# Patient Record
Sex: Male | Born: 1970 | Race: White | Hispanic: No | Marital: Married | Smoking: Current every day smoker
Health system: Southern US, Community
[De-identification: ages and names within clinical notes are randomized; demographics above are authoritative.]

## PROBLEM LIST (undated history)

## (undated) DIAGNOSIS — R634 Abnormal weight loss: Secondary | ICD-10-CM

## (undated) DIAGNOSIS — C8590 Non-Hodgkin lymphoma, unspecified, unspecified site: Secondary | ICD-10-CM

## (undated) DIAGNOSIS — F1910 Other psychoactive substance abuse, uncomplicated: Secondary | ICD-10-CM

## (undated) DIAGNOSIS — G4730 Sleep apnea, unspecified: Secondary | ICD-10-CM

## (undated) DIAGNOSIS — F329 Major depressive disorder, single episode, unspecified: Secondary | ICD-10-CM

## (undated) DIAGNOSIS — K219 Gastro-esophageal reflux disease without esophagitis: Secondary | ICD-10-CM

## (undated) DIAGNOSIS — R0602 Shortness of breath: Secondary | ICD-10-CM

## (undated) DIAGNOSIS — R011 Cardiac murmur, unspecified: Secondary | ICD-10-CM

## (undated) DIAGNOSIS — C801 Malignant (primary) neoplasm, unspecified: Secondary | ICD-10-CM

## (undated) DIAGNOSIS — F32A Depression, unspecified: Secondary | ICD-10-CM

## (undated) DIAGNOSIS — S3992XA Unspecified injury of lower back, initial encounter: Secondary | ICD-10-CM

## (undated) HISTORY — DX: Other psychoactive substance abuse, uncomplicated: F19.10

## (undated) HISTORY — PX: KNEE SURGERY: SHX244

## (undated) HISTORY — DX: Depression, unspecified: F32.A

## (undated) HISTORY — DX: Cardiac murmur, unspecified: R01.1

## (undated) HISTORY — DX: Major depressive disorder, single episode, unspecified: F32.9

---

## 2002-06-02 ENCOUNTER — Encounter: Payer: Self-pay | Admitting: *Deleted

## 2002-06-02 ENCOUNTER — Emergency Department (HOSPITAL_COMMUNITY): Admission: AD | Admit: 2002-06-02 | Discharge: 2002-06-02 | Payer: Self-pay | Admitting: *Deleted

## 2003-05-03 ENCOUNTER — Emergency Department (HOSPITAL_COMMUNITY): Admission: EM | Admit: 2003-05-03 | Discharge: 2003-05-03 | Payer: Self-pay | Admitting: Emergency Medicine

## 2004-03-12 ENCOUNTER — Emergency Department (HOSPITAL_COMMUNITY): Admission: EM | Admit: 2004-03-12 | Discharge: 2004-03-12 | Payer: Self-pay | Admitting: Emergency Medicine

## 2004-03-15 ENCOUNTER — Emergency Department (HOSPITAL_COMMUNITY): Admission: EM | Admit: 2004-03-15 | Discharge: 2004-03-16 | Payer: Self-pay | Admitting: Emergency Medicine

## 2004-10-10 ENCOUNTER — Emergency Department (HOSPITAL_COMMUNITY): Admission: EM | Admit: 2004-10-10 | Discharge: 2004-10-10 | Payer: Self-pay | Admitting: Emergency Medicine

## 2004-11-24 ENCOUNTER — Ambulatory Visit: Payer: Self-pay | Admitting: Internal Medicine

## 2005-04-13 ENCOUNTER — Ambulatory Visit: Payer: Self-pay | Admitting: Internal Medicine

## 2005-10-04 ENCOUNTER — Emergency Department (HOSPITAL_COMMUNITY): Admission: EM | Admit: 2005-10-04 | Discharge: 2005-10-04 | Payer: Self-pay | Admitting: Emergency Medicine

## 2005-10-26 ENCOUNTER — Emergency Department (HOSPITAL_COMMUNITY): Admission: EM | Admit: 2005-10-26 | Discharge: 2005-10-27 | Payer: Self-pay | Admitting: *Deleted

## 2005-10-29 ENCOUNTER — Ambulatory Visit: Payer: Self-pay | Admitting: Internal Medicine

## 2006-02-20 ENCOUNTER — Emergency Department (HOSPITAL_COMMUNITY): Admission: EM | Admit: 2006-02-20 | Discharge: 2006-02-20 | Payer: Self-pay | Admitting: Emergency Medicine

## 2006-05-11 ENCOUNTER — Emergency Department (HOSPITAL_COMMUNITY): Admission: EM | Admit: 2006-05-11 | Discharge: 2006-05-12 | Payer: Self-pay | Admitting: Emergency Medicine

## 2006-05-11 ENCOUNTER — Ambulatory Visit: Payer: Self-pay | Admitting: Psychiatry

## 2006-05-11 ENCOUNTER — Inpatient Hospital Stay (HOSPITAL_COMMUNITY): Admission: RE | Admit: 2006-05-11 | Discharge: 2006-05-15 | Payer: Self-pay | Admitting: Psychiatry

## 2007-08-17 ENCOUNTER — Emergency Department (HOSPITAL_COMMUNITY): Admission: EM | Admit: 2007-08-17 | Discharge: 2007-08-17 | Payer: Self-pay | Admitting: Emergency Medicine

## 2008-07-12 ENCOUNTER — Inpatient Hospital Stay (HOSPITAL_COMMUNITY): Admission: EM | Admit: 2008-07-12 | Discharge: 2008-07-16 | Payer: Self-pay | Admitting: Emergency Medicine

## 2009-07-12 ENCOUNTER — Emergency Department (HOSPITAL_COMMUNITY): Admission: EM | Admit: 2009-07-12 | Discharge: 2009-07-12 | Payer: Self-pay | Admitting: Emergency Medicine

## 2009-07-23 ENCOUNTER — Ambulatory Visit: Payer: Self-pay | Admitting: Family

## 2009-07-23 ENCOUNTER — Ambulatory Visit: Payer: Self-pay | Admitting: Diagnostic Radiology

## 2009-07-23 ENCOUNTER — Emergency Department (HOSPITAL_BASED_OUTPATIENT_CLINIC_OR_DEPARTMENT_OTHER): Admission: EM | Admit: 2009-07-23 | Discharge: 2009-07-23 | Payer: Self-pay | Admitting: Emergency Medicine

## 2009-07-23 DIAGNOSIS — R109 Unspecified abdominal pain: Secondary | ICD-10-CM | POA: Insufficient documentation

## 2010-01-20 ENCOUNTER — Ambulatory Visit: Payer: Self-pay | Admitting: Radiology

## 2010-01-20 ENCOUNTER — Emergency Department (HOSPITAL_BASED_OUTPATIENT_CLINIC_OR_DEPARTMENT_OTHER): Admission: EM | Admit: 2010-01-20 | Discharge: 2010-01-20 | Payer: Self-pay | Admitting: Emergency Medicine

## 2010-04-02 NOTE — Assessment & Plan Note (Signed)
Summary: blood in stool/cbs--room 4   Vital Signs:  Patient profile:   40 year old male Height:      72 inches Weight:      170 pounds BMI:     23.14 Temp:     98.6 degrees F oral Pulse rate:   102 / minute Pulse rhythm:   regular Resp:     16 per minute BP sitting:   138 / 72  (right arm) Cuff size:   regular  Vitals Entered By: Mervin Kung CMA (Jul 23, 2009 1:28 PM) CC: room 4 Intermittent  Abdominal pain and bright red blood in stools since yesterday. No n/v. Is Patient Diabetic? No Pain Assessment Patient in pain? yes     Location: abdomen Intensity: 10+ Type: sharp   CC:  room 4 Intermittent  Abdominal pain and bright red blood in stools since yesterday. No n/v..  History of Present Illness: Mr Holcomb is a 40 yearold male who presents with severe abdominal pain which started yesterday at 5:30PM.  Notes that he has had BRBPR x 6 today, most recent BM was "orange in color".  Patient notes that 1 month ago he had 1 episode of melena.  Denies vomitting but notes + nausea.  Pain is worse if he eats or drinks.  Patient was told that he had a ulcer years a go.  Patient tells me that he has seen Dr.  Alwyn Ren in the past, but has not seen him in a while.    Allergies (verified): No Known Drug Allergies  Past History:  Past Medical History: Depression Heart Murmur polysubstance abuse (reportedly clean x 3 1/2 years)  Past Surgical History: knee surgery  Family History: Mom- No health problems Dad-healthy 1 brother- healthy 1 sister-healthy 27 year old daughter- healthy 18 year old sonr- healthy  Social History: Works as a Warehouse manager Denies ETOH Previous substance abuse (marijuana and cocaine)- reportedly clean +smoking- 1 to 1.5 packs/day  Physical Exam  General:  Uncomfortable appearing white male, holding stomach during much of the exam. Lungs:  Normal respiratory effort, chest expands symmetrically. Lungs are clear to auscultation, no crackles  or wheezes. Heart:  Normal rate and regular rhythm. S1 and S2 normal without gallop, murmur, click, rub or other extra sounds. Abdomen:  Soft, hypoactive bowel sounds.  + pain noted in RUQ on deep palpation, mild guarding is noted. Psych:  Cognition and judgment appear intact. Alert and cooperative with normal attention span and concentration. No apparent delusions, illusions, hallucinations   Impression & Recommendations:  Problem # 1:  ABDOMINAL PAIN, ACUTE (ICD-789.00) Assessment New Patient is with acute abdominal pain and hematochezia.  Recent NSAID use due to history of  back pain.  I suspect that his symptoms are due to Ulcer which may be bleeding.  Spoke to [facility] [place] PA-C)  They perfer that the patient be sent to the ER to ensure that he is stable prior to admitting to floor.  Will send downstairs to ED.  Complete Medication List: 1)  Ecotrin Low Strength 81 Mg Tbec (Aspirin) .... Take 1 tablet by mouth once a day  Patient Instructions: 1)  Please go directly to the ED downstairs for evaluation. 2)  Follow up with Dr. Alwyn Ren after you are discharged. 3)  Stop aspirin and anti-inflammatories (ibuprofen, aleve etc.)  Current Allergies (reviewed today): No known allergies

## 2010-04-24 ENCOUNTER — Emergency Department (HOSPITAL_COMMUNITY): Payer: Self-pay

## 2010-04-24 ENCOUNTER — Emergency Department (HOSPITAL_COMMUNITY)
Admission: EM | Admit: 2010-04-24 | Discharge: 2010-04-24 | Disposition: A | Discharge status: Home / Self Care | Payer: Self-pay | Attending: Emergency Medicine | Admitting: Emergency Medicine

## 2010-04-24 DIAGNOSIS — R404 Transient alteration of awareness: Secondary | ICD-10-CM | POA: Insufficient documentation

## 2010-04-24 DIAGNOSIS — S0003XA Contusion of scalp, initial encounter: Secondary | ICD-10-CM | POA: Insufficient documentation

## 2010-04-24 DIAGNOSIS — Y929 Unspecified place or not applicable: Secondary | ICD-10-CM | POA: Insufficient documentation

## 2010-04-24 DIAGNOSIS — S0990XA Unspecified injury of head, initial encounter: Secondary | ICD-10-CM | POA: Insufficient documentation

## 2010-04-24 DIAGNOSIS — R51 Headache: Secondary | ICD-10-CM | POA: Insufficient documentation

## 2010-04-24 DIAGNOSIS — H9319 Tinnitus, unspecified ear: Secondary | ICD-10-CM | POA: Insufficient documentation

## 2010-05-18 LAB — URINALYSIS, ROUTINE W REFLEX MICROSCOPIC
Bilirubin Urine: NEGATIVE
Glucose, UA: NEGATIVE mg/dL
Leukocytes, UA: NEGATIVE
Nitrite: NEGATIVE
Urobilinogen, UA: 0.2 mg/dL (ref 0.0–1.0)
pH: 7 (ref 5.0–8.0)

## 2010-05-18 LAB — COMPREHENSIVE METABOLIC PANEL
ALT: 21 U/L (ref 0–53)
AST: 26 U/L (ref 0–37)
BUN: 12 mg/dL (ref 6–23)
Calcium: 9.3 mg/dL (ref 8.4–10.5)
Chloride: 103 mEq/L (ref 96–112)
GFR calc non Af Amer: >60 mL/min (ref >60)
Glucose, Bld: 88 mg/dL (ref 70–99)
Potassium: 4.1 mEq/L (ref 3.5–5.1)
Total Protein: 7.4 g/dL (ref 6.0–8.3)

## 2010-05-18 LAB — DIFFERENTIAL
Basophils Relative: 2 % — ABNORMAL HIGH (ref 0–1)
Eosinophils Absolute: 0 10*3/uL (ref 0.0–0.7)
Lymphocytes Relative: 9 % — ABNORMAL LOW (ref 12–46)
Monocytes Relative: 6 % (ref 3–12)

## 2010-05-18 LAB — HEMOCCULT GUIAC POC 1CARD (OFFICE): Fecal Occult Bld: NEGATIVE

## 2010-05-19 LAB — POCT CARDIAC MARKERS
CKMB, poc: <1 ng/mL — ABNORMAL LOW (ref 1.0–8.0)
Myoglobin, poc: 48.7 ng/mL (ref 12–200)
Myoglobin, poc: 54 ng/mL (ref 12–200)
Troponin i, poc: <0.05 ng/mL (ref 0.00–0.09)

## 2010-05-19 LAB — RAPID URINE DRUG SCREEN, HOSP PERFORMED
Amphetamines: NOT DETECTED
Cocaine: NOT DETECTED
Tetrahydrocannabinol: POSITIVE — AB

## 2010-05-19 LAB — DIFFERENTIAL
Basophils Absolute: 0.1 10*3/uL (ref 0.0–0.1)
Basophils Relative: 1 % (ref 0–1)
Eosinophils Relative: 3 % (ref 0–5)
Lymphocytes Relative: 28 % (ref 12–46)
Neutro Abs: 4.3 10*3/uL (ref 1.7–7.7)

## 2010-05-19 LAB — COMPREHENSIVE METABOLIC PANEL
ALT: 18 U/L (ref 0–53)
Albumin: 3.7 g/dL (ref 3.5–5.2)
BUN: 9 mg/dL (ref 6–23)
Calcium: 8.9 mg/dL (ref 8.4–10.5)
Chloride: 102 mEq/L (ref 96–112)
Creatinine, Ser: 1.1 mg/dL (ref 0.4–1.5)
GFR calc non Af Amer: >60 mL/min (ref >60)
Total Bilirubin: 0.7 mg/dL (ref 0.3–1.2)

## 2010-05-19 LAB — CBC
HCT: 41.8 % (ref 39.0–52.0)
Hemoglobin: 14.4 g/dL (ref 13.0–17.0)
WBC: 6.9 10*3/uL (ref 4.0–10.5)

## 2010-06-09 LAB — CBC
HCT: 40.7 % (ref 39.0–52.0)
Hemoglobin: 14.6 g/dL (ref 13.0–17.0)
Hemoglobin: 15.4 g/dL (ref 13.0–17.0)
MCV: 88.2 fL (ref 78.0–100.0)
MCV: 88.2 fL (ref 78.0–100.0)
Platelets: 192 10*3/uL (ref 150–400)
RBC: 4.97 MIL/uL (ref 4.22–5.81)
RDW: 13.7 % (ref 11.5–15.5)
RDW: 13.8 % (ref 11.5–15.5)
WBC: 7.4 10*3/uL (ref 4.0–10.5)

## 2010-06-09 LAB — BASIC METABOLIC PANEL
BUN: 8 mg/dL (ref 6–23)
CO2: 25 mEq/L (ref 19–32)
CO2: 25 mEq/L (ref 19–32)
Chloride: 108 mEq/L (ref 96–112)
Creatinine, Ser: 1.1 mg/dL (ref 0.4–1.5)
GFR calc Af Amer: >60 mL/min (ref >60)
GFR calc non Af Amer: >60 mL/min (ref >60)
Sodium: 139 mEq/L (ref 135–145)

## 2010-06-09 LAB — DIFFERENTIAL
Basophils Absolute: 0.1 10*3/uL (ref 0.0–0.1)
Eosinophils Absolute: 0.2 10*3/uL (ref 0.0–0.7)
Eosinophils Relative: 2 % (ref 0–5)
Lymphocytes Relative: 38 % (ref 12–46)
Lymphs Abs: 2.3 10*3/uL (ref 0.7–4.0)
Monocytes Absolute: 0.5 10*3/uL (ref 0.1–1.0)
Neutro Abs: 4.5 10*3/uL (ref 1.7–7.7)
Neutrophils Relative %: 61 % (ref 43–77)

## 2010-06-09 LAB — GLUCOSE, CAPILLARY: Glucose-Capillary: 93 mg/dL (ref 70–99)

## 2010-07-14 NOTE — Discharge Summary (Signed)
[name], [name]               ACCOUNT NO.:  [id number]   MEDICAL RECORD NO.:  [id number]          PATIENT TYPE:  INP   LOCATION:  A338                          FACILITY:  [facility]   PHYSICIAN:  Osvaldo Shipper, MD     DATE OF BIRTH:  04-14-1970   DATE OF ADMISSION:  07/12/2008  DATE OF DISCHARGE:  05/18/2010LH                               DISCHARGE SUMMARY   Please refer to H&P dictated at the time of admission for details  regarding the patient's presenting illness.   The patient does not have a PMD.   DISCHARGE DIAGNOSES:  1. Acute lower back pain with lumbar radiculopathy.  2. L4-L5 disk herniation and annular tear, and L5-S1 disk herniation.   BRIEF HOSPITAL COURSE:  Briefly, this is a 40 year old white male who  was in his usual state of health the day prior to admission when he  lifted his son and was using the vacuum cleaner when he suddenly  experienced excruciating lower back pain.  He decided to rest for the  rest of the day, however, did not feel any better and so decided to come  into the hospital.  Underwent thoracic spine film which was negative.  Lumbar spine films showed mild disk height loss at L5-S1.  Subsequently,  MRI of the L-spine was obtained which showed probable acute annular  tears at L4-L5 centrally with a small, focal, subligamentous disk  herniation centrally and into the right lateral recess likely  compressing the right L5 nerve root.  Soft disk herniation centrally and  into the left neural foramen was also noted as L5-S1.   The patient was admitted for pain control.  He was put on muscle  relaxants, narcotic agents, NSAIDs, and steroids.  The patient was seen  by physical therapists.  He slowly started improving.  He was able to  ambulate with a walker.  Home Health PT is being set up for him.  This  case was discussed by the ED physician with Dr. Gerlene Fee and so an  appointment has been made with this physician for this patient.  On the  day of  discharge, the patient is feeling well.  He is very satisfied  with his care here.  Denies any complaints this morning except for  persistent pain which has improved as well.  Vital signs are all stable.  He does not have any neurological deficits at this time.   Lungs are clear to auscultation.  Cardiovascular is S1, S2 is normal and  regular.   The patient is stable for discharge home with Home Health.   DISCHARGE MEDICATIONS:  1. Flexeril 10 mg t.i.d.  2. Colace 100 mg b.i.d.  3. Neurontin 300 mg t.i.d.  4. Indomethacin 25 mg b.i.d.  5. Ultram 50 mg 4 times daily for 2 weeks and then as needed, 60      tablets prescribed.  6. MiraLax 17 g daily.  7. Prednisone 60 mg daily for 4 days, followed by 40 mg daily for 4      days, followed by 20 mg daily for 4 days, followed by  10 mg daily      for 4 days.   Follow up with Dr. Gerlene Fee.  Appointment made for Jul 22, 2008 at 1:45  p.m.  Home Health PT to be set up.  He has already been arranged a  walker.   DIET:  As before.   PHYSICAL ACTIVITY:  Per physical therapist.   TOTAL TIME OF THIS DISCHARGE ENCOUNTER:  35 minutes.      Osvaldo Shipper, MD  Electronically Signed     GK/MEDQ  D:  07/16/2008  T:  07/17/2008  Job:  [id number]   cc:   Reinaldo Meeker, M.D.  Fax: [phone]

## 2010-07-14 NOTE — H&P (Signed)
NAMEJENTRY, WARNELL               ACCOUNT NO.:  [id number]   MEDICAL RECORD NO.:  [id number]          PATIENT TYPE:  EMS   LOCATION:  ED                            FACILITY:  [facility]   PHYSICIAN:  Osvaldo Shipper, MD     DATE OF BIRTH:  1970/07/20   DATE OF ADMISSION:  07/12/2008  DATE OF DISCHARGE:  LH                              HISTORY & PHYSICAL   Patient does not have a primary medical doctor.   ADMITTING DIAGNOSES:  1. Acute lower back pain.  2. History of depression.  3. History of heart murmur.   CHIEF COMPLAINT:  Back pain since yesterday morning.   HISTORY OF PRESENT ILLNESS:  The patient is a 40 year old Caucasian male  who has a history of depression which is stable who was in his usual  state of health yesterday morning when he was trying to plug a vacuum  cleaner and lifting his son at the same time, and when he did that he  felt a pop in his lower back and started experiencing excruciating pain.  The pain was more than 10/10 in intensity.  The patient, however, did  not have any falling down episodes or any sudden give-away of his legs.  The pain started getting worse throughout yesterday and also the night,  he could sleep, he could not walk because of severe pain.  He started  experiencing tingling and numbness in his feet, more so on the right  side than the left.  He could not stand on his legs this morning.  Denied any urinary or stool incontinence.  He was sweating.  He was  nauseous.  Then, since these symptoms were not improving, he decided to  come in to the hospital.  He did try taking a Xanax with no relief.  Otherwise, there is no other aggravating or relieving factors identified  except as stated above, the pain was described as a crushing sensation  with radiation down both legs as described.   MEDICATIONS AT HOME:  None.   ALLERGIES:  No known drug allergies.   PAST MEDICAL HISTORY:  Positive for:  1. Heart murmur.  2. History of depression.  He  has actually been admitted to [facility] for severe depression back in 2008.   SURGICAL HISTORY:  None.   SOCIAL HISTORY:  He is supposed to start a new job at [employer].  Smokes 1 pack of cigarettes on a daily basis.  Denies any alcohol use or any illicit drug use.   FAMILY HISTORY:  Positive for heart disease, colon cancer, otherwise  unremarkable.   REVIEW OF SYSTEMS:  GENERAL:  Positive for weakness.  HEENT:  Unremarkable.  CARDIOVASCULAR:  Unremarkable.  RESPIRATORY:  Unremarkable.  GI:  Unremarkable.  GU:  Unremarkable.  NEUROLOGICAL:  As  in HPI.  MUSCULOSKELETAL:  As in HPI.  Dermatological unremarkable.  PSYCHIATRIC:  Unremarkable.  OTHER SYSTEMS:  Unremarkable.   PHYSICAL EXAMINATION:  VITAL SIGNS:  Temperature 98.0, blood pressure  116/56, heart rate 16, respiratory rate 17, saturation 99%  on room air.  GENERAL:  This is a well-developed, nourished white male in no distress.  In a considerable amount of discomfort, however.  HEENT: There is no pallor, no icterus.  Oral mucous membrane is moist.  No oral lesions are noted.  NECK:  Soft and supple.  No thyromegaly appreciated.  LUNGS:  Clear to auscultation bilaterally.  No wheezing, rales or  rhonchi.  CARDIOVASCULAR:  S1-S2 is normal, regular.  No murmurs appreciated.  No  S3, no S4.  No rubs, no bruits.  ABDOMEN:  Soft, nontender, nondistended.  Bowel sounds are present.  No  masses or organomegaly appreciated.  LOWER BACK:  Tenderness over the lumbosacral spine and some muscle spasm  laterally, more to the right side.  OTHER MUSCULOSKELETAL EXAM:  He does have positive straight leg-raising  sign bilaterally, more so on the right side.  He is able to left and  flex his ankles bilaterally.  He does have good strength in his ankle  flexors.  Peripheral pulses are palpable.  Did have some sensory  deficits.  RECTAL:  Was done and he did have good rectal tone.   No labs have been  done.  He had imaging studies in the form of a  thoracic spine film which did not show any acute findings and he had a  lumbar spine film which showed mild disk height loss at L5-S1.  He had  an MRI, subsequently, which showed probable acute annular tear at L4-5  centrally with a small subligamentous disk herniation centrally and into  the right lateral recess, likely compressing the right L5 nerve root,  soft disk herniation centrally and into the left neural foramen noted at  L5-S1.   ASSESSMENT:  This is a 40 year old,  Caucasian male who comes in with  acute lower back pain with onset yesterday morning.  Persistent factor  was the very fact that he was trying to lift his son up.  He suffered  annular tear and herniation, which caused him his acute symptoms.  He  already has slight compression of the nerve root.  He will need  admission into the hospital for pain control.   PLAN:  Acute lower back pain as a result of disk herniation and tear.  The case was discussed with Dr. Gerlene Fee via the PA at the ED and he  recommends medical management at this time and follow up in his office  when the patient is somewhat better.  We will go ahead and consult our  orthopedic doctors to see if anything else needs to be done.  I will go  ahead in the meantime and start the patient on Ultram, on Indocin,  Flexeril, Solu-Medrol and Neurontin.  Physical therapy will be utilized.  A heating pad will be provided for local care.  An information sheet  will be given to him regarding back pain.  DVT prophylaxis will be  utilized.   Further management decisions will depend on the patient's response to  treatment and results of tests.  We will also order a CBC and BMET at  the same time.      Osvaldo Shipper, MD  Electronically Signed     GK/MEDQ  D:  07/12/2008  T:  07/12/2008  Job:  [id number]   cc:   Teola Bradley, M.D.  Fax: [phone]

## 2010-07-17 NOTE — Discharge Summary (Signed)
[name], [name]               ACCOUNT NO.:  [id number]   MEDICAL RECORD NO.:  [id number]          PATIENT TYPE:  IPS   LOCATION:  0306                          FACILITY:  [facility]   PHYSICIAN:  Anselm Jungling, MD  DATE OF BIRTH:  September 08, 1970   DATE OF ADMISSION:  05/11/2006  DATE OF DISCHARGE:  05/15/2006                               DISCHARGE SUMMARY   IDENTIFYING DATA/REASON FOR ADMISSION:  The patient is a 40 year old  male with a history of substance abuse and mood disorder.  He had  recently lost his job and in addition was having marital problems,  complicated by having three children, with one more on the way.  He had  been abusing both cocaine and alcohol.  He had no prior history of  inpatient or outpatient psychiatric treatment.  He had made a suicide  attempt in the past.  He was admitted this time due to increasing  suicidal risk.  Please refer to the admission note for further details  pertaining to the symptoms, circumstances and history that led to his  hospitalization.   INITIAL DIAGNOSTIC IMPRESSION:  He was given initial AXIS I diagnoses of  mood disorder not otherwise specified, substance abuse not otherwise  specified and substance-induced mood disorder.   MEDICAL/LABORATORY:  The patient was medically and physically assessed  by the psychiatric nurse practitioner.  He was in good health without  any active or chronic medical problems.   HOSPITAL COURSE:  The patient was admitted to the adult inpatient  psychiatric service.  He presented as a well-nourished, normally  developed male who was pleasant, alert and fully oriented.  His mood was  depressed with tearful affect.  He was open in giving history and  verbalized a strong desire for help.  He did not have any further  suicidal ideation or intention once admitted and stated I'm tired of  having these thoughts.  There were no signs or symptoms of psychosis.   The patient was involved in various  therapeutic activities including  those geared towards 12-step recovery.  To address intermittent anxiety,  Seroquel in low doses of 25-50 mg was utilized.  The patient was begun  on a trial of Depakote ER which was titrated during his hospital stay.   On the first hospital day, the patient did need to be sent to the  emergency department for evaluation of chest pain, but he was returned  without any acute findings.   On the fifth and final hospital day, there was a family session  involving the patient and his girlfriend.  Main concerns addressed were  around medication and finding ways to pay for it.  The patient was  advised to call his caseworker if having such problems.  He reported  that he felt well on the current medication.  He talked about staying  busy with his children and attending 12-step meetings.  The patient's  girlfriend was quite supportive and agreed to help the patient attend 12-  step meetings.  The patient stated that he was having no further  suicidal ideation, and his girlfriend  indicated she had no concerns  about him going home at that time.  He was discharged following the  session.  The patient and his girlfriend had been given information on  suicide prevention as well as the suicide prevention pamphlet and the  crisis hotline numbers card.   AFTERCARE:  The patient was to follow up at the [facility] with an  appointment on May 19, 2006.   DISCHARGE MEDICATIONS:  Depakote ER 500 mg b.i.d.   DISCHARGE DIAGNOSES:  AXIS I:  Polysubstance dependence.  Mood disorder  not otherwise specified.  AXIS II:  Deferred.  AXIS III:  No acute or chronic illnesses.  AXIS IV:  Stressors:  Severe.  AXIS V:  GAF on discharge 60.      Anselm Jungling, MD  Electronically Signed     SPB/MEDQ  D:  06/03/2006  T:  06/03/2006  Job:  [id number]

## 2011-05-03 ENCOUNTER — Emergency Department (HOSPITAL_COMMUNITY)
Admission: EM | Admit: 2011-05-03 | Discharge: 2011-05-03 | Disposition: A | Discharge status: Home / Self Care | Payer: BC Managed Care – PPO | Attending: Emergency Medicine | Admitting: Emergency Medicine

## 2011-05-03 ENCOUNTER — Encounter (HOSPITAL_COMMUNITY): Payer: Self-pay | Admitting: *Deleted

## 2011-05-03 DIAGNOSIS — F17200 Nicotine dependence, unspecified, uncomplicated: Secondary | ICD-10-CM | POA: Insufficient documentation

## 2011-05-03 DIAGNOSIS — J019 Acute sinusitis, unspecified: Secondary | ICD-10-CM | POA: Insufficient documentation

## 2011-05-03 MED ORDER — AMOXICILLIN-POT CLAVULANATE 875-125 MG PO TABS: 1.0000 | ORAL_TABLET | Freq: Two times a day (BID) | ORAL | Status: AC

## 2011-05-03 MED ORDER — AMOXICILLIN-POT CLAVULANATE 875-125 MG PO TABS
1.0000 | ORAL_TABLET | Freq: Once | ORAL | Status: AC
  Administered 2011-05-03: 1 via ORAL
  Filled 2011-05-03: qty 1

## 2011-05-03 NOTE — Discharge Instructions (Signed)

## 2011-05-03 NOTE — ED Notes (Signed)
Cold symptoms for over a month, worse for the past 3 days

## 2011-05-03 NOTE — ED Notes (Signed)
Sick for several weeks, sinus congestion sore throat, cough.  Body aches.   Brown sputum last pm.

## 2011-05-04 NOTE — ED Provider Notes (Signed)
History     CSN: [id number]  Arrival date & time 05/03/11  4098   First MD Initiated Contact with Patient 05/03/11 2015      Chief Complaint  Patient presents with  . Sinusitis    (Consider location/radiation/quality/duration/timing/severity/associated sxs/prior treatment) HPI Comments: Patient presents with a 1 month history of nasal congestion,  Itchy watery eyes and clear rhinorrhea.  The past 3 days he has developed maxillary facial pressure,  achiness in in upper teeth,  Thicker nasal congestion and subjective fever.  Patient is a 41 y.o. male presenting with sinusitis. The history is provided by the patient.  Sinusitis  This is a new problem. The current episode started more than 2 days ago. The problem has been gradually worsening. The pain is at a severity of 9/10. The pain is moderate. The pain has been constant since onset. Associated symptoms include chills and congestion. Pertinent negatives include no ear pain, no sore throat and no shortness of breath. Treatments tried: decongestants. The treatment provided mild relief.    History reviewed. No pertinent past medical history.  Past Surgical History  Procedure Date  . Knee surgery     No family history on file.  History  Substance Use Topics  . Smoking status: Current Everyday Smoker -- 1.0 packs/day  . Smokeless tobacco: Not on file  . Alcohol Use: No      Review of Systems  Constitutional: Positive for fever, chills and fatigue.  HENT: Positive for congestion and rhinorrhea. Negative for ear pain, sore throat, facial swelling and neck pain.   Eyes: Negative.   Respiratory: Negative for chest tightness and shortness of breath.   Cardiovascular: Negative for chest pain.  Gastrointestinal: Negative for nausea and abdominal pain.  Genitourinary: Negative.   Musculoskeletal: Negative for joint swelling and arthralgias.  Skin: Negative.  Negative for rash and wound.  Neurological: Negative for dizziness,  weakness, light-headedness, numbness and headaches.  Hematological: Negative.   Psychiatric/Behavioral: Negative.     Allergies  Review of patient's allergies indicates no known allergies.  Home Medications   Current Outpatient Rx  Name Route Sig Dispense Refill  . AMOXICILLIN-POT CLAVULANATE 875-125 MG PO TABS Oral Take 1 tablet by mouth 2 (two) times daily. 19 tablet 0    BP 117/82  Pulse 77  Temp(Src) 98.9 F (37.2 C) (Oral)  Resp 20  Ht 6' (1.829 m)  Wt 172 lb (78.019 kg)  BMI 23.33 kg/m2  SpO2 100%  Physical Exam  Nursing note and vitals reviewed. Constitutional: He is oriented to person, place, and time. He appears well-developed and well-nourished.  HENT:  Head: Normocephalic and atraumatic.  Right Ear: Tympanic membrane normal.  Left Ear: Tympanic membrane normal.  Nose: Mucosal edema, rhinorrhea and sinus tenderness present. Right sinus exhibits maxillary sinus tenderness. Left sinus exhibits maxillary sinus tenderness.  Mouth/Throat: Uvula is midline, oropharynx is clear and moist and mucous membranes are normal.  Eyes: Conjunctivae are normal.  Neck: Normal range of motion.  Cardiovascular: Normal rate, regular rhythm, normal heart sounds and intact distal pulses.   Pulmonary/Chest: Effort normal and breath sounds normal. He has no decreased breath sounds. He has no wheezes. He has no rhonchi. He has no rales.  Abdominal: Soft. Bowel sounds are normal. There is no tenderness.  Musculoskeletal: Normal range of motion.  Neurological: He is alert and oriented to person, place, and time.  Skin: Skin is warm and dry.  Psychiatric: He has a normal mood and affect.    ED  Course  Procedures (including critical care time)  Labs Reviewed - No data to display No results found.   1. Sinusitis acute       MDM  Augementin,  Continue otc decongestant (tylenol cold and congestion product).  Encouraged daily otc claritin when allergy sx are  present.        Candis Musa, PA 05/04/11 1341

## 2011-05-05 NOTE — ED Provider Notes (Signed)
Medical screening examination/treatment/procedure(s) were performed by non-physician practitioner and as supervising physician I was immediately available for consultation/collaboration.   Logen Heintzelman, MD 05/05/11 0832 

## 2011-07-06 ENCOUNTER — Emergency Department (HOSPITAL_COMMUNITY)
Admission: EM | Admit: 2011-07-06 | Discharge: 2011-07-06 | Disposition: A | Discharge status: Home / Self Care | Payer: BC Managed Care – PPO | Attending: Emergency Medicine | Admitting: Emergency Medicine

## 2011-07-06 ENCOUNTER — Encounter (HOSPITAL_COMMUNITY): Payer: Self-pay | Admitting: *Deleted

## 2011-07-06 ENCOUNTER — Emergency Department (HOSPITAL_COMMUNITY): Payer: BC Managed Care – PPO

## 2011-07-06 DIAGNOSIS — Y990 Civilian activity done for income or pay: Secondary | ICD-10-CM | POA: Insufficient documentation

## 2011-07-06 DIAGNOSIS — IMO0002 Reserved for concepts with insufficient information to code with codable children: Secondary | ICD-10-CM | POA: Insufficient documentation

## 2011-07-06 DIAGNOSIS — S40019A Contusion of unspecified shoulder, initial encounter: Secondary | ICD-10-CM | POA: Insufficient documentation

## 2011-07-06 DIAGNOSIS — S40012A Contusion of left shoulder, initial encounter: Secondary | ICD-10-CM

## 2011-07-06 MED ORDER — OXYCODONE-ACETAMINOPHEN 5-325 MG PO TABS: 1.0000 | ORAL_TABLET | Freq: Four times a day (QID) | ORAL | Status: AC | PRN

## 2011-07-06 MED ORDER — OXYCODONE-ACETAMINOPHEN 5-325 MG PO TABS
1.0000 | ORAL_TABLET | Freq: Once | ORAL | Status: AC
  Administered 2011-07-06: 1 via ORAL
  Filled 2011-07-06: qty 1

## 2011-07-06 NOTE — ED Provider Notes (Signed)
History  This chart was scribed for Carleene Cooper III, MD by Cherlynn Perches. The patient was seen in room APA18/APA18. Patient's care was started at 2018.   CSN: [id number]  Arrival date & time 07/06/11  2018   None     Chief Complaint  Patient presents with  . Shoulder Injury    (Consider location/radiation/quality/duration/timing/severity/associated sxs/prior treatment) Patient is a 41 y.o. male presenting with shoulder injury. The history is provided by the patient. No language interpreter was used.  Shoulder Injury This is a new problem. The current episode started 3 to 5 hours ago. The problem occurs constantly. The problem has not changed since onset.Pertinent negatives include no chest pain and no abdominal pain. The symptoms are aggravated by bending and twisting. The symptoms are relieved by nothing. He has tried nothing for the symptoms.    [name] is a 41 y.o. male who presents to the Emergency Department complaining of sudden onset, constant, moderate shoulder pain localized to the back of the left shoulder with associated swelling of the left shoulder. Pt reports that he was working under a house and a sewage pipe blew. As he tried to get out of the way, the pt hit his shoulder on the floor "pretty hard." Pt works as a Nutritional therapist. Pt states that pain is made worse by moving his arm at the shoulder joint. Pt has no significant medical history. Pt is a current everyday smoker and denies alcohol use.  History reviewed. No pertinent past medical history.  Past Surgical History  Procedure Date  . Knee surgery     No family history on file.  History  Substance Use Topics  . Smoking status: Current Everyday Smoker -- 1.0 packs/day  . Smokeless tobacco: Not on file  . Alcohol Use: No      Review of Systems  Constitutional: Negative for fever and chills.  Respiratory: Negative for cough and wheezing.   Cardiovascular: Negative for chest pain.  Gastrointestinal:  Negative for nausea, abdominal pain and diarrhea.  Musculoskeletal: Positive for joint swelling and arthralgias.  Skin: Negative for rash.  Neurological: Negative for syncope and numbness.  Psychiatric/Behavioral: Negative for confusion.  All other systems reviewed and are negative.    Allergies  Review of patient's allergies indicates no known allergies.  Home Medications  No current outpatient prescriptions on file.  Triage Vitals: BP 126/76  Pulse 75  Temp(Src) 98.1 F (36.7 C) (Oral)  Resp 18  Ht 5\' 11"  (1.803 m)  Wt 175 lb (79.379 kg)  BMI 24.41 kg/m2  SpO2 99%  Physical Exam  Nursing note and vitals reviewed. Constitutional: He is oriented to person, place, and time. He appears well-developed and well-nourished.  HENT:  Head: Normocephalic and atraumatic.  Eyes: Conjunctivae are normal. Pupils are equal, round, and reactive to light.  Neck: Normal range of motion. Neck supple. No tracheal deviation present.  Cardiovascular: Normal rate and regular rhythm.   Pulmonary/Chest: Effort normal. No respiratory distress.  Abdominal: Soft. He exhibits no distension.  Musculoskeletal: Normal range of motion. He exhibits tenderness (tenderness over body of left scapula).       2 cm Contusion over body of left scapula, no bony deformity, normal left humerus and clavicle  Neurological: He is alert and oriented to person, place, and time.       No numbness of left hand  Skin: Skin is warm and dry.  Psychiatric: He has a normal mood and affect. His behavior is normal.  ED Course  Procedures (including critical care time)  DIAGNOSTIC STUDIES: Oxygen Saturation is 99% on room air, normal by my interpretation.    COORDINATION OF CARE: 10:35AM - discussed x-ray results with pt. Advised pt to ice and will prescribe medication for pain. Pt agrees with plan.      Dg Clavicle Left  07/06/2011  *RADIOLOGY REPORT*  Clinical Data: Larey Seat, pain  LEFT CLAVICLE - 2+ VIEWS  Comparison:   None.  Findings:  There is no evidence of fracture or other focal bone lesions.  Soft tissues are unremarkable.  IMPRESSION: Negative.  Original Report Authenticated By: Elsie Stain, M.D.   Has contusion of spine of left scapula.  Can see scapula on views of clavicle, and no fracture present. Rx ice, pain medication, rest.   1. Contusion of left scapula      I personally performed the services described in this documentation, which was scribed in my presence. The recorded information has been reviewed and considered.  Osvaldo Human, M.D.      Carleene Cooper III, MD 07/07/11 [phone]

## 2011-07-06 NOTE — Discharge Instructions (Signed)
You have a contusion of the left scapula.  You can take the pain medicine Percocet every 4 hours if needed for pain.  Apply ice to the painful area for 15 minutes four times per day.  No work for 3 days.

## 2011-07-06 NOTE — ED Notes (Signed)
Given pillow to place behind left shoulder. In no distress. Denies needs. Call bell within reach. Pain 8\10. Will continue to monitor. Awaiting to be seen.

## 2011-07-06 NOTE — ED Notes (Signed)
Into room to attempt iv access. 

## 2011-07-06 NOTE — ED Notes (Signed)
Resting with eyes closed and lights off. No distress. Equal chest rise and fall, regular, unlabored. Will continue to monitor. Call bell within reach. Awaiting to be seen.

## 2011-07-06 NOTE — ED Notes (Signed)
Pt reports he was working under a house and a sewage pipe blew and he hit left shoulder on floor,

## 2011-07-06 NOTE — ED Notes (Signed)
Into room to assess patient. Resting sitting up in bed. Deformity to left clavicle and left shoulder blade. Strong left radial pulse. Brisk cap refill. No discoloration to extremity. Limited range of motion. Denies needs. No distress. Call bell within reach. Awaiting to be seen.

## 2011-07-06 NOTE — ED Notes (Signed)
MD at bedside. 

## 2011-09-07 ENCOUNTER — Ambulatory Visit (INDEPENDENT_AMBULATORY_CARE_PROVIDER_SITE_OTHER): Payer: BC Managed Care – PPO | Admitting: Internal Medicine

## 2011-09-07 ENCOUNTER — Encounter: Payer: Self-pay | Admitting: Internal Medicine

## 2011-09-07 VITALS — BP 100/68 | HR 68 | Temp 98.1°F | Resp 16 | Ht 70.0 in | Wt 158.1 lb

## 2011-09-07 DIAGNOSIS — I889 Nonspecific lymphadenitis, unspecified: Secondary | ICD-10-CM

## 2011-09-07 DIAGNOSIS — R319 Hematuria, unspecified: Secondary | ICD-10-CM

## 2011-09-07 MED ORDER — AMOXICILLIN-POT CLAVULANATE 500-125 MG PO TABS: ORAL_TABLET | ORAL | Status: DC

## 2011-09-07 NOTE — Patient Instructions (Addendum)
Use warm moist compresses to 3 times a day to the affected area.  Please try to go on My Chart within the next 24 hours to allow me to release the results directly to you.  

## 2011-09-07 NOTE — Progress Notes (Signed)
  Subjective:    Patient ID: [name], male    DOB: 12-22-1970, 41 y.o.   MRN: [id number]  HPI He noticed a slight "pulling" in the right inguinal area 08/05/11. Investigating, he noted a knot which has seemingly increased in size since. Yesterday & this morning was hard and associated with discomfort radiating obliquely, laterally in the right inguinal crease.  He's also had some sweating; a 15 pound weight loss over the last 8 months he believes is related to his high level of physical activity.  He's had no injury or vector exposure prior to the onset of the knot.  Past medical history/family history/social history were all reviewed and updated. Pertinent data entered into EMR. Remotely he did have recurrent cervical and axillary lymphadenitis.      Review of Systems He denies abdominal pain other than the inguinal discomfort. He's had no associated melena, rectal bleeding, chills, or fever. He also denies hematuria, pyuria, or dysuria.  He has no history of MRSA.     Objective:   Physical Exam General appearance is one of good health and nourishment w/o distress.  Eyes: No conjunctival inflammation or scleral icterus is present.  Oral exam: Dental hygiene is good; lips and gums are healthy appearing.There is no oropharyngeal erythema or exudate noted.   Heart:  Normal rate and regular rhythm. S1 and S2 normal without gallop, murmur, click, rub . S 4      Lungs:Chest clear to auscultation; no wheezes, rhonchi,rales ,or rubs present.No increased work of breathing.   Abdomen: bowel sounds normal, soft but slightly tender right lower quadrant without masses, organomegaly or hernias noted.  No guarding or rebound   Genital exam was unremarkable; no hernias are present. Prostate is normal to palpation. There is no adnexal tenderness on either side.  Skin:Warm & dry.  Intact without suspicious lesions or rashes ; no jaundice . His course showed a shaven; there is no evidence of  cellulitis  Lymphatic: No lymphadenopathy is noted about the head, neck,or  axilla. He has a 1.5 x 1.5 cm very tender right inguinal lymph node.              Assessment & Plan:  #1 lymphadenitis right inguinal area; no obvious site of infection, specifically no cellulitis. There is no hernia present.  #2 right lower quadrant tenderness; clinically there is no evidence of acute appendicitis.  Plan: CBC and differential, urinalysis, and oral antibiotics. Imaging should be considered if symptoms persist or progress despite the antibiotics.

## 2011-09-08 ENCOUNTER — Ambulatory Visit (INDEPENDENT_AMBULATORY_CARE_PROVIDER_SITE_OTHER)
Admission: RE | Admit: 2011-09-08 | Discharge: 2011-09-08 | Disposition: A | Discharge status: Home / Self Care | Payer: BC Managed Care – PPO | Source: Ambulatory Visit | Attending: Internal Medicine | Admitting: Internal Medicine

## 2011-09-08 ENCOUNTER — Telehealth: Payer: Self-pay | Admitting: Internal Medicine

## 2011-09-08 ENCOUNTER — Other Ambulatory Visit: Payer: BC Managed Care – PPO

## 2011-09-08 DIAGNOSIS — R10813 Right lower quadrant abdominal tenderness: Secondary | ICD-10-CM

## 2011-09-08 DIAGNOSIS — R1031 Right lower quadrant pain: Secondary | ICD-10-CM

## 2011-09-08 DIAGNOSIS — I889 Nonspecific lymphadenitis, unspecified: Secondary | ICD-10-CM

## 2011-09-08 LAB — CBC WITH DIFFERENTIAL/PLATELET
Basophils Absolute: 0 10*3/uL (ref 0.0–0.1)
Eosinophils Absolute: 0 10*3/uL (ref 0.0–0.7)
HCT: 45.6 % (ref 39.0–52.0)
Hemoglobin: 15.8 g/dL (ref 13.0–17.0)
Lymphs Abs: 1 10*3/uL (ref 0.7–4.0)
MCHC: 34.6 g/dL (ref 30.0–36.0)
Neutro Abs: 3.8 10*3/uL (ref 1.4–7.7)
RDW: 13.4 % (ref 11.5–14.6)

## 2011-09-08 LAB — POCT URINALYSIS DIPSTICK
Nitrite, UA: NEGATIVE
Protein, UA: NEGATIVE
Urobilinogen, UA: 0.2
pH, UA: 6

## 2011-09-08 MED ORDER — IOHEXOL 300 MG/ML  SOLN
100.0000 mL | Freq: Once | INTRAMUSCULAR | Status: AC | PRN
  Administered 2011-09-08: 100 mL via INTRAVENOUS

## 2011-09-08 NOTE — Telephone Encounter (Signed)
Pt walked into the office this morning to let us know that his symptoms are getting worse. Patient mentioned possibly getting an xray. He would like someone to call him back. [phone]

## 2011-09-08 NOTE — Telephone Encounter (Signed)
Per Vo, ordered CT w/o Abdomen/pelvis: RLQ tenderness & pain, inguinal lymphadenitis: STAT per Dr. Cory Munch [[facility]] informed, pre-certing insurance. Patient informed, will be awaiting call back/SLS

## 2011-09-08 NOTE — Telephone Encounter (Signed)
Order changed per Radiologist, needs to be With contrast; [facility] advised/SLS

## 2011-09-08 NOTE — Addendum Note (Signed)
Addended by: Silvio Pate D on: 09/08/2011 09:15 AM   Modules accepted: Orders

## 2011-09-30 ENCOUNTER — Telehealth: Payer: Self-pay

## 2011-09-30 NOTE — Telephone Encounter (Signed)
Patient returned called and indicated he is still having a pulling/cramping sensation in lymph node area off/on. Last episode of this sensation was Tuesday, when patient got up on Wednesday sensation was gone. Patient states the raised area went down once he completed ABX but is still present.   Dr.Hopper please advise

## 2011-09-30 NOTE — Telephone Encounter (Signed)
I left message on voicemail, patient was seen 09/07/11 and per Dr.Hopper f/u and see how patient is doing

## 2011-09-30 NOTE — Telephone Encounter (Signed)
If the swelling in the inguinal area persists; I do recommend getting an evaluation by a general surgeon. Please let me know if you have a preference

## 2011-09-30 NOTE — Telephone Encounter (Signed)
please call back - pt called again has more information for you  CB# [phone]

## 2011-10-05 ENCOUNTER — Other Ambulatory Visit: Payer: Self-pay | Admitting: Internal Medicine

## 2011-10-05 DIAGNOSIS — I889 Nonspecific lymphadenitis, unspecified: Secondary | ICD-10-CM

## 2011-10-05 DIAGNOSIS — R634 Abnormal weight loss: Secondary | ICD-10-CM

## 2011-10-05 NOTE — Telephone Encounter (Signed)
I spoke with patient and he agreed to referral to general surgeon, no preference. Patient would also like to let Dr.Hopper know that he discussed his concerns with his mother and she told him that his grandfather died of colon cancer and she also had colon problem. (Patient not sure if this could be related to his concern or not)

## 2011-10-27 ENCOUNTER — Encounter (INDEPENDENT_AMBULATORY_CARE_PROVIDER_SITE_OTHER): Payer: Self-pay | Admitting: Surgery

## 2011-10-27 ENCOUNTER — Ambulatory Visit (INDEPENDENT_AMBULATORY_CARE_PROVIDER_SITE_OTHER): Payer: BC Managed Care – PPO | Admitting: Surgery

## 2011-10-27 VITALS — BP 120/72 | HR 60 | Temp 98.0°F | Resp 14 | Ht 71.0 in | Wt 154.6 lb

## 2011-10-27 DIAGNOSIS — R590 Localized enlarged lymph nodes: Secondary | ICD-10-CM

## 2011-10-27 DIAGNOSIS — R599 Enlarged lymph nodes, unspecified: Secondary | ICD-10-CM

## 2011-10-27 NOTE — Patient Instructions (Signed)
Lymphadenopathy Lymphadenopathy means "disease of the lymph glands." But the term is usually used to describe swollen or enlarged lymph glands, also called lymph nodes. These are the bean-shaped organs found in many locations including the neck, underarm, and groin. Lymph glands are part of the immune system, which fights infections in your body. Lymphadenopathy can occur in just one area of the body, such as the neck, or it can be generalized, with lymph node enlargement in several areas. The nodes found in the neck are the most common sites of lymphadenopathy. CAUSES  When your immune system responds to germs (such as viruses or bacteria ), infection-fighting cells and fluid build up. This causes the glands to grow in size. This is usually not something to worry about. Sometimes, the glands themselves can become infected and inflamed. This is called lymphadenitis. Enlarged lymph nodes can be caused by many diseases:  Bacterial disease, such as strep throat or a skin infection.   Viral disease, such as a common cold.   Other germs, such as lyme disease, tuberculosis, or sexually transmitted diseases.   Cancers, such as lymphoma (cancer of the lymphatic system) or leukemia (cancer of the white blood cells).   Inflammatory diseases such as lupus or rheumatoid arthritis.   Reactions to medications.  Many of the diseases above are rare, but important. This is why you should see your caregiver if you have lymphadenopathy. SYMPTOMS   Swollen, enlarged lumps in the neck, back of the head or other locations.   Tenderness.   Warmth or redness of the skin over the lymph nodes.   Fever.  DIAGNOSIS  Enlarged lymph nodes are often near the source of infection. They can help healthcare providers diagnose your illness. For instance:   Swollen lymph nodes around the jaw might be caused by an infection in the mouth.   Enlarged glands in the neck often signal a throat infection.   Lymph nodes that  are swollen in more than one area often indicate an illness caused by a virus.  Your caregiver most likely will know what is causing your lymphadenopathy after listening to your history and examining you. Blood tests, x-rays or other tests may be needed. If the cause of the enlarged lymph node cannot be found, and it does not go away by itself, then a biopsy may be needed. Your caregiver will discuss this with you. TREATMENT  Treatment for your enlarged lymph nodes will depend on the cause. Many times the nodes will shrink to normal size by themselves, with no treatment. Antibiotics or other medicines may be needed for infection. Only take over-the-counter or prescription medicines for pain, discomfort or fever as directed by your caregiver. HOME CARE INSTRUCTIONS  Swollen lymph glands usually return to normal when the underlying medical condition goes away. If they persist, contact your health-care provider. He/she might prescribe antibiotics or other treatments, depending on the diagnosis. Take any medications exactly as prescribed. Keep any follow-up appointments made to check on the condition of your enlarged nodes.  SEEK MEDICAL CARE IF:   Swelling lasts for more than two weeks.   You have symptoms such as weight loss, night sweats, fatigue or fever that does not go away.   The lymph nodes are hard, seem fixed to the skin or are growing rapidly.   Skin over the lymph nodes is red and inflamed. This could mean there is an infection.  SEEK IMMEDIATE MEDICAL CARE IF:   Fluid starts leaking from the area of the   enlarged lymph node.   You develop a fever of 102 F (38.9 C) or greater.   Severe pain develops (not necessarily at the site of a large lymph node).   You develop chest pain or shortness of breath.   You develop worsening abdominal pain.  MAKE SURE YOU:   Understand these instructions.   Will watch your condition.   Will get help right away if you are not doing well or get  worse.  Document Released: 11/25/2007 Document Revised: 02/04/2011 Document Reviewed: 11/25/2007 [employer] Patient Information 2012 [employer]. 

## 2011-10-27 NOTE — Progress Notes (Signed)
Patient ID: [name], male   DOB: 06/30/1970, 40 y.o.   MRN: [id number]  No chief complaint on file.   HPI [name] is a 41 y.o. male.  Patient sent at the request of Dr. Hopper do to right groin pain for 6 weeks. He was seen and placed on antibiotics for some enlarged right inguinal lymph nodes with some decrease in size. The pain in his right groin is not gone away. He was evaluated by exam which showed no evidence of inguinal hernia. CT scan of the abdomen and pelvis done showed some mildly enlarged bilateral inguinal and external iliac lymph nodes. He is at 15 pound weight loss over last month and does have night sweats and fatigue. HPI  Past Medical History  Diagnosis Date  . Depression   . Heart murmur   . Polysubstance abuse     10/26/2006 @ birth of son    Past Surgical History  Procedure Date  . Knee surgery     Family History  Problem Relation Age of Onset  . Colon cancer Maternal Grandfather     Social History History  Substance Use Topics  . Smoking status: Current Everyday Smoker -- 1.0 packs/day  . Smokeless tobacco: Not on file  . Alcohol Use: No      quit 10/26/2006    No Known Allergies  Current Outpatient Prescriptions  Medication Sig Dispense Refill  . aspirin 81 MG tablet Take 81 mg by mouth 3 (three) times a week. Take [1] tablet 2-3 times weekly.        Review of Systems Review of Systems  Constitutional: Positive for fever, chills, diaphoresis, fatigue and unexpected weight change.  HENT: Negative.   Eyes: Negative.   Respiratory: Positive for cough.   Cardiovascular: Negative.   Gastrointestinal: Negative.   Genitourinary: Negative.   Musculoskeletal: Negative.   Neurological: Negative.   Hematological: Positive for adenopathy.  Psychiatric/Behavioral: Negative.     Blood pressure 120/72, pulse 60, temperature 98 F (36.7 C), resp. rate 14, height 5' 11" (1.803 m), weight 154 lb 9.6 oz (70.126 kg).  Physical Exam Physical  Exam  Constitutional: He appears well-developed and well-nourished.  HENT:  Head: Normocephalic and atraumatic.  Eyes: Conjunctivae and EOM are normal. Pupils are equal, round, and reactive to light.  Neck: Normal range of motion. Neck supple.  Cardiovascular: Normal rate and regular rhythm.   Pulmonary/Chest: Effort normal and breath sounds normal.  Abdominal: Soft. Bowel sounds are normal.  Lymphadenopathy:       Head (right side): No submental, no submandibular, no tonsillar, no preauricular, no posterior auricular and no occipital adenopathy present.       Head (left side): No submental, no submandibular, no tonsillar, no preauricular, no posterior auricular and no occipital adenopathy present.    He has no cervical adenopathy.    He has no axillary adenopathy.       Right: Inguinal adenopathy present.       Left: Inguinal adenopathy present.  Skin: Skin is warm and dry.  Psychiatric: He has a normal mood and affect. His behavior is normal. Judgment and thought content normal.    Data Reviewed *RADIOLOGY REPORT*  Clinical Data: Right lower quadrant tenderness and pain  CT ABDOMEN AND PELVIS WITH CONTRAST  Technique: Multidetector CT imaging of the abdomen and pelvis was  performed following the standard protocol during bolus  administration of intravenous contrast.  Contrast: 100mL OMNIPAQUE IOHEXOL 300 MG/ML SOLN  Comparison: 07/23/2009  Findings:   The lung bases are clear. No pericardial or pleural  effusion.  There is no focal liver abnormality. The gallbladder appears  normal. No biliary dilatation. The pancreas appears normal.  Normal appearance of the spleen. The stomach appears normal. The  small bowel loops are unremarkable. The appendix is identified and  appears normal. Normal appearance of the colon.  Both adrenal glands appear within normal limits. Normal appearance  of the right kidney. The left kidney is also normal. The urinary  bladder appears normal. Prostate  gland and seminal vesicles are  negative.  No upper abdominal adenopathy. 1.3 cm, image 68. There is a right  inguinal lymph node which measures 9.6 mm. New from previous exam.  Review of the visualized osseous structures is unremarkable.  IMPRESSION:  1. No acute findings identified within the abdomen or pelvis. The  appendix is identified and appears normal.  2. Borderline enlarged right external iliac and right inguinal  lymph nodes.   Assessment   inguinal lymphadenopathy right greater than left history of night sweats and 15 pound weight loss over last month with fatigue    Plan    Right inguinal lymph node biopsy. Risk of bleeding, infection, blood vessel injury, nerve injury, DVT, lymphocele and the need for further operation and treatment discussed. He agrees to proceed.       Glynn Yepes A. 10/27/2011, 11:34 AM    

## 2011-10-28 ENCOUNTER — Encounter (HOSPITAL_COMMUNITY): Payer: Self-pay | Admitting: Pharmacy Technician

## 2011-10-29 ENCOUNTER — Encounter (HOSPITAL_COMMUNITY): Payer: Self-pay

## 2011-10-29 ENCOUNTER — Ambulatory Visit (HOSPITAL_COMMUNITY)
Admission: RE | Admit: 2011-10-29 | Discharge: 2011-10-29 | Disposition: A | Discharge status: Home / Self Care | Payer: BC Managed Care – PPO | Source: Ambulatory Visit | Attending: Surgery | Admitting: Surgery

## 2011-10-29 ENCOUNTER — Encounter (HOSPITAL_COMMUNITY)
Admission: RE | Admit: 2011-10-29 | Discharge: 2011-10-29 | Disposition: A | Discharge status: Home / Self Care | Payer: BC Managed Care – PPO | Source: Ambulatory Visit | Attending: Surgery | Admitting: Surgery

## 2011-10-29 DIAGNOSIS — R599 Enlarged lymph nodes, unspecified: Secondary | ICD-10-CM | POA: Insufficient documentation

## 2011-10-29 DIAGNOSIS — R0602 Shortness of breath: Secondary | ICD-10-CM | POA: Insufficient documentation

## 2011-10-29 DIAGNOSIS — Z01812 Encounter for preprocedural laboratory examination: Secondary | ICD-10-CM | POA: Insufficient documentation

## 2011-10-29 DIAGNOSIS — G4730 Sleep apnea, unspecified: Secondary | ICD-10-CM

## 2011-10-29 DIAGNOSIS — F17200 Nicotine dependence, unspecified, uncomplicated: Secondary | ICD-10-CM | POA: Insufficient documentation

## 2011-10-29 HISTORY — DX: Sleep apnea, unspecified: G47.30

## 2011-10-29 HISTORY — DX: Abnormal weight loss: R63.4

## 2011-10-29 HISTORY — DX: Gastro-esophageal reflux disease without esophagitis: K21.9

## 2011-10-29 HISTORY — DX: Shortness of breath: R06.02

## 2011-10-29 LAB — CBC
MCV: 86.1 fL (ref 78.0–100.0)
Platelets: 212 10*3/uL (ref 150–400)
RDW: 13.2 % (ref 11.5–15.5)
WBC: 8.4 10*3/uL (ref 4.0–10.5)

## 2011-10-29 LAB — COMPREHENSIVE METABOLIC PANEL
Albumin: 4.1 g/dL (ref 3.5–5.2)
Alkaline Phosphatase: 67 U/L (ref 39–117)
BUN: 11 mg/dL (ref 6–23)
Chloride: 103 mEq/L (ref 96–112)
Glucose, Bld: 89 mg/dL (ref 70–99)
Potassium: 4.1 mEq/L (ref 3.5–5.1)
Total Bilirubin: 0.6 mg/dL (ref 0.3–1.2)

## 2011-10-29 MED ORDER — CHLORHEXIDINE GLUCONATE 4 % EX LIQD: 1.0000 "application " | Freq: Once | CUTANEOUS | Status: DC

## 2011-10-29 NOTE — Pre-Procedure Instructions (Signed)
PREOP CBC, CMET, CXR WERE DONE TODAY AT [facility] AS PER ORDERS DR. CORNETT AND ANESTHESIOLOGIST'S GUIDELINES. PREOP INSTRUCTIONS DISCUSSED WITH PT USING TEACH BACK METHOD.

## 2011-10-29 NOTE — Patient Instructions (Signed)
YOUR SURGERY IS SCHEDULED ON:  WED 9/4  AT 11:00 AM  REPORT TO [facility] AT:  9:00 AM      PHONE # FOR SHORT STAY IS [phone]  DO NOT EAT OR DRINK ANYTHING AFTER MIDNIGHT THE NIGHT BEFORE YOUR SURGERY.  YOU MAY BRUSH YOUR TEETH, RINSE OUT YOUR MOUTH--BUT NO WATER, NO FOOD, NO CHEWING GUM, NO MINTS, NO CANDIES, NO CHEWING TOBACCO.  PLEASE TAKE THE FOLLOWING MEDICATIONS THE AM OF YOUR SURGERY WITH A FEW SIPS OF WATER:  NO MEDS  TO TAKE    IF YOU USE INHALERS--USE YOUR INHALERS THE AM OF YOUR SURGERY AND BRING INHALERS TO THE HOSPITAL -TAKE TO SURGERY.    IF YOU ARE DIABETIC:  DO NOT TAKE ANY DIABETIC MEDICATIONS THE AM OF YOUR SURGERY.  IF YOU TAKE INSULIN IN THE EVENINGS--PLEASE ONLY TAKE 1/2 NORMAL EVENING DOSE THE NIGHT BEFORE YOUR SURGERY.  NO INSULIN THE AM OF YOUR SURGERY.  IF YOU HAVE SLEEP APNEA AND USE CPAP OR BIPAP--PLEASE BRING THE MASK --NOT THE MACHINE-NOT THE TUBING   -JUST THE MASK. DO NOT BRING VALUABLES, MONEY, CREDIT CARDS.  CONTACT LENS, DENTURES / PARTIALS, GLASSES SHOULD NOT BE WORN TO SURGERY AND IN MOST CASES-HEARING AIDS WILL NEED TO BE REMOVED.  BRING YOUR GLASSES CASE, ANY EQUIPMENT NEEDED FOR YOUR CONTACT LENS. FOR PATIENTS ADMITTED TO THE HOSPITAL--CHECK OUT TIME THE DAY OF DISCHARGE IS 11:00 AM.  ALL INPATIENT ROOMS ARE PRIVATE - WITH BATHROOM, TELEPHONE, TELEVISION AND WIFI INTERNET. IF YOU ARE BEING DISCHARGED THE SAME DAY OF YOUR SURGERY--YOU CAN NOT DRIVE YOURSELF HOME--AND SHOULD NOT GO HOME ALONE BY TAXI OR BUS.  NO DRIVING OR OPERATING MACHINERY FOR 24 HOURS FOLLOWING ANESTHESIA / PAIN MEDICATIONS.                            SPECIAL INSTRUCTIONS:  CHLORHEXIDINE SOAP SHOWER (other brand names are Betasept and Hibiclens ) PLEASE SHOWER WITH CHLORHEXIDINE THE NIGHT BEFORE YOUR SURGERY AND THE AM OF YOUR SURGERY. DO NOT USE CHLORHEXIDINE ON YOUR FACE OR PRIVATE AREAS--YOU MAY USE YOUR NORMAL SOAP THOSE AREAS AND YOUR NORMAL SHAMPOO.  WOMEN  SHOULD AVOID SHAVING UNDER ARMS AND SHAVING LEGS 48 HOURS BEFORE USING CHLORHEXIDINE TO AVOID SKIN IRRITATION.  DO NOT USE IF ALLERGIC TO CHLORHEXIDINE.  PLEASE READ OVER ANY  FACT SHEETS THAT YOU WERE GIVEN: MRSA INFORMATION

## 2011-10-29 NOTE — Progress Notes (Signed)
10/29/11 1601  OBSTRUCTIVE SLEEP APNEA  Have you ever been diagnosed with sleep apnea through a sleep study? No  Do you snore loudly (loud enough to be heard through closed doors)?  1  Do you often feel tired, fatigued, or sleepy during the daytime? 1  Has anyone observed you stop breathing during your sleep? 1  Do you have, or are you being treated for high blood pressure? 0  BMI more than 35 kg/m2? 0  Age over 39 years old? 0  Neck circumference greater than 40 cm/18 inches? 0  Gender: 1  Obstructive Sleep Apnea Score 4   Score 4 or greater  Updated health history

## 2011-10-30 ENCOUNTER — Encounter: Payer: Self-pay | Admitting: Internal Medicine

## 2011-10-30 DIAGNOSIS — G4733 Obstructive sleep apnea (adult) (pediatric): Secondary | ICD-10-CM | POA: Insufficient documentation

## 2011-11-01 LAB — MRSA CULTURE

## 2011-11-02 MED ORDER — DEXTROSE 5 % IV SOLN
3.0000 g | INTRAVENOUS | Status: AC
  Administered 2011-11-03: 2 g via INTRAVENOUS
  Filled 2011-11-02: qty 3000

## 2011-11-02 NOTE — Anesthesia Preprocedure Evaluation (Addendum)
Anesthesia Evaluation  Patient identified by MRN, date of birth, ID band Patient awake    Reviewed: Allergy & Precautions, H&P , NPO status , Patient's Chart, lab work & pertinent test results  Airway Mallampati: II TM Distance: >3 FB Neck ROM: full    Dental No notable dental hx. (+) Teeth Intact and Dental Advisory Given   Pulmonary sleep apnea , Current Smoker,  Stop bang 4 breath sounds clear to auscultation  Pulmonary exam normal       Cardiovascular Exercise Tolerance: Good negative cardio ROS  Rhythm:regular Rate:Normal     Neuro/Psych Depression negative neurological ROS  negative psych ROS   GI/Hepatic negative GI ROS, GERD-  ,(+)     substance abuse   , Polysubstance abuse   Endo/Other  negative endocrine ROS  Renal/GU negative Renal ROS  negative genitourinary   Musculoskeletal   Abdominal   Peds  Hematology negative hematology ROS (+)   Anesthesia Other Findings   Reproductive/Obstetrics negative OB ROS                         Anesthesia Physical Anesthesia Plan  ASA: II  Anesthesia Plan: General   Post-op Pain Management:    Induction: Intravenous  Airway Management Planned: LMA  Additional Equipment:   Intra-op Plan:   Post-operative Plan: Extubation in OR  Informed Consent: I have reviewed the patients History and Physical, chart, labs and discussed the procedure including the risks, benefits and alternatives for the proposed anesthesia with the patient or authorized representative who has indicated his/her understanding and acceptance.   Dental Advisory Given  Plan Discussed with: CRNA and Surgeon  Anesthesia Plan Comments:       Anesthesia Quick Evaluation

## 2011-11-03 ENCOUNTER — Ambulatory Visit (HOSPITAL_COMMUNITY): Payer: BC Managed Care – PPO | Admitting: Anesthesiology

## 2011-11-03 ENCOUNTER — Encounter (HOSPITAL_COMMUNITY): Payer: Self-pay | Admitting: *Deleted

## 2011-11-03 ENCOUNTER — Ambulatory Visit (HOSPITAL_COMMUNITY)
Admission: RE | Admit: 2011-11-03 | Discharge: 2011-11-03 | Disposition: A | Discharge status: Home / Self Care | Payer: BC Managed Care – PPO | Source: Ambulatory Visit | Attending: Surgery | Admitting: Surgery

## 2011-11-03 ENCOUNTER — Encounter (HOSPITAL_COMMUNITY)
Admission: RE | Disposition: A | Discharge status: Home / Self Care | Payer: Self-pay | Source: Ambulatory Visit | Attending: Surgery

## 2011-11-03 ENCOUNTER — Encounter (HOSPITAL_COMMUNITY): Payer: Self-pay | Admitting: Anesthesiology

## 2011-11-03 DIAGNOSIS — R5381 Other malaise: Secondary | ICD-10-CM | POA: Insufficient documentation

## 2011-11-03 DIAGNOSIS — R599 Enlarged lymph nodes, unspecified: Secondary | ICD-10-CM | POA: Insufficient documentation

## 2011-11-03 DIAGNOSIS — R079 Chest pain, unspecified: Secondary | ICD-10-CM

## 2011-11-03 DIAGNOSIS — G4733 Obstructive sleep apnea (adult) (pediatric): Secondary | ICD-10-CM

## 2011-11-03 DIAGNOSIS — R61 Generalized hyperhidrosis: Secondary | ICD-10-CM | POA: Insufficient documentation

## 2011-11-03 DIAGNOSIS — Z7982 Long term (current) use of aspirin: Secondary | ICD-10-CM | POA: Insufficient documentation

## 2011-11-03 DIAGNOSIS — R634 Abnormal weight loss: Secondary | ICD-10-CM | POA: Insufficient documentation

## 2011-11-03 DIAGNOSIS — F17200 Nicotine dependence, unspecified, uncomplicated: Secondary | ICD-10-CM | POA: Insufficient documentation

## 2011-11-03 DIAGNOSIS — R590 Localized enlarged lymph nodes: Secondary | ICD-10-CM

## 2011-11-03 HISTORY — PX: OTHER SURGICAL HISTORY: SHX169

## 2011-11-03 SURGERY — BIOPSY, LYMPH NODE, INGUINAL, OPEN
Anesthesia: General | Laterality: Right | Wound class: Clean

## 2011-11-03 MED ORDER — CEFAZOLIN SODIUM-DEXTROSE 2-3 GM-% IV SOLR
INTRAVENOUS | Status: AC
  Filled 2011-11-03: qty 50

## 2011-11-03 MED ORDER — LACTATED RINGERS IV SOLN: INTRAVENOUS | Status: DC

## 2011-11-03 MED ORDER — LIDOCAINE HCL (CARDIAC) 20 MG/ML IV SOLN
INTRAVENOUS | Status: DC | PRN
  Administered 2011-11-03: 75 mg via INTRAVENOUS

## 2011-11-03 MED ORDER — 0.9 % SODIUM CHLORIDE (POUR BTL) OPTIME
TOPICAL | Status: DC | PRN
  Administered 2011-11-03: 1000 mL

## 2011-11-03 MED ORDER — HYDROCODONE-ACETAMINOPHEN 5-325 MG PO TABS
1.0000 | ORAL_TABLET | Freq: Four times a day (QID) | ORAL | Status: AC | PRN
Start: 2011-11-03 — End: 2011-11-13

## 2011-11-03 MED ORDER — ACETAMINOPHEN 10 MG/ML IV SOLN
INTRAVENOUS | Status: DC | PRN
  Administered 2011-11-03: 1000 mg via INTRAVENOUS

## 2011-11-03 MED ORDER — MIDAZOLAM HCL 5 MG/5ML IJ SOLN
INTRAMUSCULAR | Status: DC | PRN
  Administered 2011-11-03: 2 mg via INTRAVENOUS

## 2011-11-03 MED ORDER — PROPOFOL 10 MG/ML IV BOLUS
INTRAVENOUS | Status: DC | PRN
  Administered 2011-11-03: 200 mg via INTRAVENOUS

## 2011-11-03 MED ORDER — LACTATED RINGERS IV SOLN
INTRAVENOUS | Status: DC | PRN
  Administered 2011-11-03: 10:00:00 via INTRAVENOUS

## 2011-11-03 MED ORDER — FENTANYL CITRATE 0.05 MG/ML IJ SOLN
INTRAMUSCULAR | Status: DC | PRN
  Administered 2011-11-03: 100 ug via INTRAVENOUS
  Administered 2011-11-03: 50 ug via INTRAVENOUS

## 2011-11-03 MED ORDER — ONDANSETRON HCL 4 MG/2ML IJ SOLN
INTRAMUSCULAR | Status: DC | PRN
  Administered 2011-11-03: 4 mg via INTRAVENOUS

## 2011-11-03 MED ORDER — HYDROCODONE-ACETAMINOPHEN 5-325 MG PO TABS
1.0000 | ORAL_TABLET | Freq: Once | ORAL | Status: AC
  Administered 2011-11-03: 1 via ORAL
  Filled 2011-11-03: qty 1

## 2011-11-03 MED ORDER — ACETAMINOPHEN 10 MG/ML IV SOLN
INTRAVENOUS | Status: AC
  Filled 2011-11-03: qty 100

## 2011-11-03 MED ORDER — BUPIVACAINE-EPINEPHRINE 0.25% -1:200000 IJ SOLN
INTRAMUSCULAR | Status: DC | PRN
  Administered 2011-11-03: 16 mL

## 2011-11-03 MED ORDER — HYDROMORPHONE HCL PF 1 MG/ML IJ SOLN: 0.2500 mg | INTRAMUSCULAR | Status: DC | PRN

## 2011-11-03 MED ORDER — BUPIVACAINE-EPINEPHRINE PF 0.25-1:200000 % IJ SOLN
INTRAMUSCULAR | Status: AC
  Filled 2011-11-03: qty 30

## 2011-11-03 SURGICAL SUPPLY — 50 items
ADH SKN CLS APL DERMABOND .7 (GAUZE/BANDAGES/DRESSINGS) ×1
APL SKNCLS STERI-STRIP NONHPOA (GAUZE/BANDAGES/DRESSINGS)
BENZOIN TINCTURE PRP APPL 2/3 (GAUZE/BANDAGES/DRESSINGS) IMPLANT
BLADE HEX COATED 2.75 (ELECTRODE) ×2 IMPLANT
BLADE SURG 15 STRL LF DISP TIS (BLADE) ×1 IMPLANT
BLADE SURG 15 STRL SS (BLADE) ×2
CANISTER SUCTION 2500CC (MISCELLANEOUS) ×2 IMPLANT
CLOTH BEACON ORANGE TIMEOUT ST (SAFETY) ×2 IMPLANT
DECANTER SPIKE VIAL GLASS SM (MISCELLANEOUS) ×2 IMPLANT
DERMABOND ADVANCED (GAUZE/BANDAGES/DRESSINGS) ×1
DERMABOND ADVANCED .7 DNX12 (GAUZE/BANDAGES/DRESSINGS) IMPLANT
DRAIN PENROSE 18X1/2 LTX STRL (DRAIN) ×2 IMPLANT
DRAPE LAPAROTOMY TRNSV 102X78 (DRAPE) ×2 IMPLANT
ELECT REM PT RETURN 9FT ADLT (ELECTROSURGICAL) ×2
ELECTRODE REM PT RTRN 9FT ADLT (ELECTROSURGICAL) ×1 IMPLANT
GLOVE BIOGEL PI IND STRL 7.0 (GLOVE) ×1 IMPLANT
GLOVE BIOGEL PI INDICATOR 7.0 (GLOVE) ×2
GLOVE ECLIPSE 6.5 STRL STRAW (GLOVE) ×2 IMPLANT
GLOVE INDICATOR 8.0 STRL GRN (GLOVE) ×4 IMPLANT
GLOVE SS BIOGEL STRL SZ 8 (GLOVE) ×1 IMPLANT
GLOVE SUPERSENSE BIOGEL SZ 8 (GLOVE) ×1
GOWN STRL NON-REIN LRG LVL3 (GOWN DISPOSABLE) ×2 IMPLANT
GOWN STRL REIN XL XLG (GOWN DISPOSABLE) ×4 IMPLANT
KIT BASIN OR (CUSTOM PROCEDURE TRAY) ×2 IMPLANT
NDL HYPO 25X1 1.5 SAFETY (NEEDLE) ×1 IMPLANT
NEEDLE HYPO 25X1 1.5 SAFETY (NEEDLE) ×2 IMPLANT
NS IRRIG 1000ML POUR BTL (IV SOLUTION) ×2 IMPLANT
PACK BASIC VI WITH GOWN DISP (CUSTOM PROCEDURE TRAY) ×2 IMPLANT
PENCIL BUTTON HOLSTER BLD 10FT (ELECTRODE) ×2 IMPLANT
SPONGE GAUZE 4X4 12PLY (GAUZE/BANDAGES/DRESSINGS) IMPLANT
SPONGE LAP 18X18 X RAY DECT (DISPOSABLE) ×2 IMPLANT
STRIP CLOSURE SKIN 1/2X4 (GAUZE/BANDAGES/DRESSINGS) IMPLANT
SUT MNCRL AB 4-0 PS2 18 (SUTURE) ×2 IMPLANT
SUT NOVA 0 T19/GS 22DT (SUTURE) IMPLANT
SUT NOVA NAB GS-21 0 18 T12 DT (SUTURE) ×4 IMPLANT
SUT SILK 2 0 SH (SUTURE) IMPLANT
SUT SILK 3 0 (SUTURE)
SUT SILK 3-0 18XBRD TIE 12 (SUTURE) IMPLANT
SUT VIC AB 0 CT1 36 (SUTURE) ×2 IMPLANT
SUT VIC AB 2-0 SH 27 (SUTURE) ×2
SUT VIC AB 2-0 SH 27X BRD (SUTURE) ×1 IMPLANT
SUT VIC AB 3-0 SH 18 (SUTURE) ×2 IMPLANT
SUT VIC AB 3-0 SH 27 (SUTURE) ×2
SUT VIC AB 3-0 SH 27XBRD (SUTURE) ×1 IMPLANT
SUT VICRYL 3 0 BR 18  UND (SUTURE) ×1
SUT VICRYL 3 0 BR 18 UND (SUTURE) ×1 IMPLANT
SYR BULB IRRIGATION 50ML (SYRINGE) ×2 IMPLANT
SYR CONTROL 10ML LL (SYRINGE) ×2 IMPLANT
TOWEL OR 17X26 10 PK STRL BLUE (TOWEL DISPOSABLE) ×2 IMPLANT
YANKAUER SUCT BULB TIP 10FT TU (MISCELLANEOUS) ×2 IMPLANT

## 2011-11-03 NOTE — H&P (View-Only) (Signed)
Patient ID: [name], male   DOB: [birth year], 41 y.o.   MRN: [id number]  No chief complaint on file.   HPI [name] is a 41 y.o. male.  Patient sent at the request of Dr. Alwyn Ren do to right groin pain for 6 weeks. He was seen and placed on antibiotics for some enlarged right inguinal lymph nodes with some decrease in size. The pain in his right groin is not gone away. He was evaluated by exam which showed no evidence of inguinal hernia. CT scan of the abdomen and pelvis done showed some mildly enlarged bilateral inguinal and external iliac lymph nodes. He is at 15 pound weight loss over last month and does have night sweats and fatigue. HPI  Past Medical History  Diagnosis Date  . Depression   . Heart murmur   . Polysubstance abuse     10/26/2006 @ birth of son    Past Surgical History  Procedure Date  . Knee surgery     Family History  Problem Relation Age of Onset  . Colon cancer Maternal Grandfather     Social History History  Substance Use Topics  . Smoking status: Current Everyday Smoker -- 1.0 packs/day  . Smokeless tobacco: Not on file  . Alcohol Use: No      quit 10/26/2006    No Known Allergies  Current Outpatient Prescriptions  Medication Sig Dispense Refill  . aspirin 81 MG tablet Take 81 mg by mouth 3 (three) times a week. Take [1] tablet 2-3 times weekly.        Review of Systems Review of Systems  Constitutional: Positive for fever, chills, diaphoresis, fatigue and unexpected weight change.  HENT: Negative.   Eyes: Negative.   Respiratory: Positive for cough.   Cardiovascular: Negative.   Gastrointestinal: Negative.   Genitourinary: Negative.   Musculoskeletal: Negative.   Neurological: Negative.   Hematological: Positive for adenopathy.  Psychiatric/Behavioral: Negative.     Blood pressure 120/72, pulse 60, temperature 98 F (36.7 C), resp. rate 14, height 5\' 11"  (1.803 m), weight 154 lb 9.6 oz (70.126 kg).  Physical Exam Physical  Exam  Constitutional: He appears well-developed and well-nourished.  HENT:  Head: Normocephalic and atraumatic.  Eyes: Conjunctivae and EOM are normal. Pupils are equal, round, and reactive to light.  Neck: Normal range of motion. Neck supple.  Cardiovascular: Normal rate and regular rhythm.   Pulmonary/Chest: Effort normal and breath sounds normal.  Abdominal: Soft. Bowel sounds are normal.  Lymphadenopathy:       Head (right side): No submental, no submandibular, no tonsillar, no preauricular, no posterior auricular and no occipital adenopathy present.       Head (left side): No submental, no submandibular, no tonsillar, no preauricular, no posterior auricular and no occipital adenopathy present.    He has no cervical adenopathy.    He has no axillary adenopathy.       Right: Inguinal adenopathy present.       Left: Inguinal adenopathy present.  Skin: Skin is warm and dry.  Psychiatric: He has a normal mood and affect. His behavior is normal. Judgment and thought content normal.    Data Reviewed *RADIOLOGY REPORT*  Clinical Data: Right lower quadrant tenderness and pain  CT ABDOMEN AND PELVIS WITH CONTRAST  Technique: Multidetector CT imaging of the abdomen and pelvis was  performed following the standard protocol during bolus  administration of intravenous contrast.  Contrast: OMNIPAQUE IOHEXOL 300 MG/ML SOLN  Comparison: 07/23/2009  Findings:  The lung bases are clear. No pericardial or pleural  effusion.  There is no focal liver abnormality. The gallbladder appears  normal. No biliary dilatation. The pancreas appears normal.  Normal appearance of the spleen. The stomach appears normal. The  small bowel loops are unremarkable. The appendix is identified and  appears normal. Normal appearance of the colon.  Both adrenal glands appear within normal limits. Normal appearance  of the right kidney. The left kidney is also normal. The urinary  bladder appears normal. Prostate  gland and seminal vesicles are  negative.  No upper abdominal adenopathy. 1.3 cm, image 68. There is a right  inguinal lymph node which measures 9.6 mm. New from previous exam.  Review of the visualized osseous structures is unremarkable.  IMPRESSION:  1. No acute findings identified within the abdomen or pelvis. The  appendix is identified and appears normal.  2. Borderline enlarged right external iliac and right inguinal  lymph nodes.   Assessment   inguinal lymphadenopathy right greater than left history of night sweats and 15 pound weight loss over last month with fatigue    Plan    Right inguinal lymph node biopsy. Risk of bleeding, infection, blood vessel injury, nerve injury, DVT, lymphocele and the need for further operation and treatment discussed. He agrees to proceed.       Sheldon Sem A. 10/27/2011, 11:34 AM

## 2011-11-03 NOTE — Transfer of Care (Signed)
Immediate Anesthesia Transfer of Care Note  Patient: [name]  Procedure(s) Performed: Procedure(s) (LRB) with comments: INGUINAL LYMPH NODE BIOPSY (Right) - Right Inguinal Lymph Node Biopsy  Patient Location: PACU  Anesthesia Type: General  Level of Consciousness: awake, oriented and patient cooperative  Airway & Oxygen Therapy: Patient Spontanous Breathing and Patient connected to face mask oxygen  Post-op Assessment: Report given to PACU RN and Post -op Vital signs reviewed and stable  Post vital signs: Reviewed and stable  Complications: No apparent anesthesia complications

## 2011-11-03 NOTE — Interval H&P Note (Signed)
History and Physical Interval Note:  11/03/2011 11:07 AM  [name]  has presented today for surgery, with the diagnosis of lymphadenopathy  The various methods of treatment have been discussed with the patient and family. After consideration of risks, benefits and other options for treatment, the patient has consented to  Procedure(s) (LRB) with comments: INGUINAL LYMPH NODE BIOPSY (Right) - Right Inguinal Lymph Node Biopsy as a surgical intervention .  The patient's history has been reviewed, patient examined, no change in status, stable for surgery.  I have reviewed the patient's chart and labs.  Questions were answered to the patient's satisfaction.     [name]

## 2011-11-03 NOTE — Anesthesia Postprocedure Evaluation (Signed)
  Anesthesia Post-op Note  Patient: [name]  Procedure(s) Performed: Procedure(s) (LRB): INGUINAL LYMPH NODE BIOPSY (Right)  Patient Location: PACU  Anesthesia Type: General  Level of Consciousness: awake and alert   Airway and Oxygen Therapy: Patient Spontanous Breathing  Post-op Pain: mild  Post-op Assessment: Post-op Vital signs reviewed, Patient's Cardiovascular Status Stable, Respiratory Function Stable, Patent Airway and No signs of Nausea or vomiting  Post-op Vital Signs: stable  Complications: No apparent anesthesia complications

## 2011-11-03 NOTE — Op Note (Signed)
Preoperative diagnosis: Right inguinal lymphadenopathy  Postoperative diagnosis: Same  Procedure: Right inguinal lymph node biopsy  Surgeon: Harriette Bouillon MD  Anesthesia:LMA with 0.25% Sensorcaine local with epinephrine  EBL: Minimal  Specimen right inguinal lymph node pathology  Drains: None  Indications for procedure: The patient presents with bilateral inguinal lymphadenopathy. He has history of weight loss, cough and night sweats. Discussed about lymph node biopsy in the office was done to exclude lymphoma. Risk, benefits and alternative therapies discussed the patient the office. Other biopsy techniques discussed with their pros and cons. He was to proceed with open up the biopsy to exclude lymphoma. Risk of bleeding, infection and nerve injury discussed.  Description of procedure: The patient was met in the holding area in the right groin was marked. Questions are answered. He is taken back to the operating room and placed supine on the operating room table. After induction of general anesthesia, right inguinal region was prepped and draped in a sterile fashion and timeout was done. He received 3 g of Ancef. The nose palpated and local anesthesia was injected into the skin and fat.  3 cm is was made and dissection was carried down through Scarpa's fascia to the aponeurosis of the external oblique. A 3 cm lymph node was excised. Hemostasis achieved with cautery. Hemostasis achieved. Wound closed with 3-0 Vicryl and 4-0 Monocryl. Dermabond applied. All final counts found to be correct. The patient was awoke extubated taken recovery in satisfactory condition.

## 2011-11-05 ENCOUNTER — Other Ambulatory Visit (INDEPENDENT_AMBULATORY_CARE_PROVIDER_SITE_OTHER): Payer: Self-pay

## 2011-11-05 ENCOUNTER — Telehealth (INDEPENDENT_AMBULATORY_CARE_PROVIDER_SITE_OTHER): Payer: Self-pay

## 2011-11-05 DIAGNOSIS — R590 Localized enlarged lymph nodes: Secondary | ICD-10-CM

## 2011-11-05 NOTE — Telephone Encounter (Signed)
I called patient to give him path results. He did not answer so I left a message for him to call back. Path show no lymphoma but a concern for possible virus (infectious?) that a hematologist will need to further work up. We are putting in a hematology oncology referral and he will be contacted with an appointment.

## 2011-11-08 ENCOUNTER — Telehealth: Payer: Self-pay | Admitting: Oncology

## 2011-11-08 NOTE — Telephone Encounter (Signed)
S/W pt in re NP appt 9/17 @ 10:30 w/Dr. Clelia Croft. Referring Dr. Luisa Hart Dx- Inguinal Lymphadenopathy  NP packet mailed.

## 2011-11-08 NOTE — Telephone Encounter (Signed)
C/D on 9/9 for appt 9/17

## 2011-11-09 ENCOUNTER — Encounter (INDEPENDENT_AMBULATORY_CARE_PROVIDER_SITE_OTHER): Payer: Self-pay

## 2011-11-12 ENCOUNTER — Other Ambulatory Visit: Payer: Self-pay | Admitting: Oncology

## 2011-11-12 DIAGNOSIS — R590 Localized enlarged lymph nodes: Secondary | ICD-10-CM

## 2011-11-16 ENCOUNTER — Other Ambulatory Visit (HOSPITAL_BASED_OUTPATIENT_CLINIC_OR_DEPARTMENT_OTHER): Payer: BC Managed Care – PPO | Admitting: Lab

## 2011-11-16 ENCOUNTER — Other Ambulatory Visit: Payer: Self-pay | Admitting: Oncology

## 2011-11-16 ENCOUNTER — Ambulatory Visit (HOSPITAL_BASED_OUTPATIENT_CLINIC_OR_DEPARTMENT_OTHER): Payer: BC Managed Care – PPO | Admitting: Oncology

## 2011-11-16 ENCOUNTER — Telehealth: Payer: Self-pay | Admitting: Oncology

## 2011-11-16 ENCOUNTER — Ambulatory Visit: Payer: BC Managed Care – PPO

## 2011-11-16 VITALS — BP 119/70 | HR 63 | Temp 98.2°F | Resp 20 | Ht 71.0 in | Wt 153.2 lb

## 2011-11-16 DIAGNOSIS — R590 Localized enlarged lymph nodes: Secondary | ICD-10-CM

## 2011-11-16 DIAGNOSIS — R599 Enlarged lymph nodes, unspecified: Secondary | ICD-10-CM

## 2011-11-16 LAB — CBC WITH DIFFERENTIAL/PLATELET
Eosinophils Absolute: 0.3 10*3/uL (ref 0.0–0.5)
MONO#: 0.5 10*3/uL (ref 0.1–0.9)
NEUT#: 4.3 10*3/uL (ref 1.5–6.5)
RBC: 5.2 10*6/uL (ref 4.20–5.82)
RDW: 14.1 % (ref 11.0–14.6)
WBC: 6.9 10*3/uL (ref 4.0–10.3)

## 2011-11-16 LAB — COMPREHENSIVE METABOLIC PANEL (CC13)
Albumin: 4.3 g/dL (ref 3.5–5.0)
Alkaline Phosphatase: 67 U/L (ref 40–150)
CO2: 24 mEq/L (ref 22–29)
Glucose: 93 mg/dl (ref 70–99)
Potassium: 4.2 mEq/L (ref 3.5–5.1)
Sodium: 139 mEq/L (ref 136–145)
Total Protein: 7.2 g/dL (ref 6.4–8.3)

## 2011-11-16 NOTE — Progress Notes (Signed)
CC:   Titus Dubin. Alwyn Ren, MD,FACP,FCCP [place] Pu. Cornett, M.D.  REASON FOR CONSULTATION:  Lymphadenopathy.  HISTORY OF PRESENT ILLNESS:  Mr. [name] is a pleasant 41 year old gentleman of [place].  He is a healthy man without really any significant past medical history who had noted on and off for the last year some inguinal adenopathy, and he had attributed it to multiple agents and issues but most recently brought it up to his primary care's attention.  He had also noted occasional fevers and night sweats and some fatigue and for that reason was evaluated by Dr. Alwyn Ren and had a CT scan of the abdomen and pelvis as well as a chest x-ray.  His abdomen and pelvis CT scan done on 09/08/2011, which showed really no acute findings in the abdomen and pelvis.  There is a right inguinal lymph node measuring 9.6 mm, it was new from previous examination, again overall appeared borderline enlarged.  He also had a chest x-ray on 10/29/2011 and referred to Dr. Luisa Hart who on 11/03/2011 underwent a right inguinal lymph node biopsy.  The patient tolerated the procedure well and the pathology from that case number [id number] showed florid lymphoid hyperplasia with dermatopathic changes with the comment saying the florid follicular hyperplasia showed nonspecific pattern but there is no morphological evidence of lymphoproliferative disorder or metastatic malignancy.  Basically the patient was referred to me for further evaluation.  Upon interviewing Mr. [name] he feels well, he has returned back to work.  He is still very fatigued at times but did not report any fevers, did not report any chills.  Does report some occasional sweats.  Does not report any viral illnesses, symptoms.  Does not report any sore throat.  Does not report anybody sick at home.  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  REVIEW OF SYSTEMS:  Not  reporting any headaches, blurred vision, double vision.  Not reporting any motor or sensory neuropathy.  Not reporting any alteration in mental status.  Not reporting any psychiatric issues, depression, any fever, chills, sweats.  Does not report any cough, hemoptysis, hematemesis.  No nausea, vomiting.  Not reporting any abdominal pain.  No hematochezia.  No melena.  No genitourinary complaints.  Rest of review of systems is unremarkable.  PAST MEDICAL HISTORY:  Really unremarkable.  He has no history of hypertension, diabetes, coronary disease, liver disease.  He had knee surgery after he had a basketball related injury.  MEDICATIONS:  He takes none.  ALLERGIES:  None.  SOCIAL HISTORY:  He is married.  He has a daughter and a son.  Denied any alcohol abuse.  He smokes a pack per day.  FAMILY HISTORY:  His mother is relatively healthy.  She takes hypertension and cholesterol medicine.  Her father had colon cancer as well as melanoma.  PHYSICAL EXAMINATION:  General:  Alert, awake, pleasant gentleman, appeared in no active distress.  Vital signs:  Blood pressure is 119/70, pulse 63, respirations 20, temperature is 98.2.  HEENT:  Head is normocephalic, atraumatic.  Pupils equal, round and reactive to light. Oral mucosa moist and pink.  Neck:  Supple without adenopathy.  Heart: Regular rate and rhythm.  S1, S2.  Lungs:  Clear to auscultation.  No rhonchi, wheeze or dullness to percussion.  Abdomen:  Soft, nontender. No hepatosplenomegaly.  Extremities:  No clubbing, cyanosis or edema. Neurologic:  Intact motor, sensory and deep  tendon reflexes.  Lymphatic: I could not appreciate any cervical, axillary or any inguinal adenopathy.  LABORATORY DATA:  Today showed a hemoglobin of 16, white cell count of 8.4, platelet count of 212, all within normal range.  He had normal chemistries on 10/29/2011.  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  I do not really see any evidence of any malignancy as evident by the lymph node biopsy that was done on 11/03/2011 case number [id number].  I discussed today in detail with Mr. Beamer and his family the differential diagnosis for mild lymphadenopathy.  Reactive lymphadenopathy is very common in a setting of a viral illness, surgery or stress.  Certainly certain infections such as EBV, HIV are all associated with lymphadenopathy.  Lymphoproliferative disorder is certainly a possibility but again at this point with a negative biopsy and really no diffuse lymphadenopathy on CT scan or examination makes it extremely unlikely.  For the time being he does not really require any further evaluation from a hematology standpoint.  However, I would recommend repeating his imaging studies in about 3 months, repeating his blood work to make sure there is indeed reactive lymph nodes resolve and if that is the case then no further evaluation is needed.  However, if we are seeing diffuse increase in lymphadenopathy then repeat biopsy would be warranted.  All his questions were answered today.    ______________________________ Benjiman Core, M.D. FNS/MEDQ  D:  11/16/2011  T:  11/16/2011  Job:  [id number]

## 2011-11-16 NOTE — Progress Notes (Signed)
Note dictated

## 2011-11-16 NOTE — Telephone Encounter (Signed)
gv pt appt schedule for December including ct.

## 2011-11-25 ENCOUNTER — Encounter (INDEPENDENT_AMBULATORY_CARE_PROVIDER_SITE_OTHER): Payer: Self-pay | Admitting: Surgery

## 2011-11-25 ENCOUNTER — Ambulatory Visit (INDEPENDENT_AMBULATORY_CARE_PROVIDER_SITE_OTHER): Payer: BC Managed Care – PPO | Admitting: Surgery

## 2011-11-25 VITALS — BP 124/62 | HR 60 | Temp 97.4°F | Resp 16 | Ht 72.0 in | Wt 160.0 lb

## 2011-11-25 DIAGNOSIS — Z9889 Other specified postprocedural states: Secondary | ICD-10-CM

## 2011-11-25 NOTE — Patient Instructions (Signed)
Return as needed

## 2011-11-25 NOTE — Progress Notes (Signed)
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 He has seen oncology and they'll see him back in 3 months.  On exam: Right groin wound clean dry intact.  Impression: Status post right groin lymph node biopsy with lymphoid hyperplasia  Plan: Return to clinic as needed. Keep oncology followup.

## 2012-02-08 ENCOUNTER — Ambulatory Visit (HOSPITAL_COMMUNITY)
Admission: RE | Admit: 2012-02-08 | Discharge: 2012-02-08 | Disposition: A | Discharge status: Home / Self Care | Payer: BC Managed Care – PPO | Source: Ambulatory Visit | Attending: Oncology | Admitting: Oncology

## 2012-02-08 ENCOUNTER — Other Ambulatory Visit (HOSPITAL_BASED_OUTPATIENT_CLINIC_OR_DEPARTMENT_OTHER): Payer: BC Managed Care – PPO

## 2012-02-08 DIAGNOSIS — R599 Enlarged lymph nodes, unspecified: Secondary | ICD-10-CM | POA: Insufficient documentation

## 2012-02-08 DIAGNOSIS — R590 Localized enlarged lymph nodes: Secondary | ICD-10-CM

## 2012-02-08 DIAGNOSIS — R911 Solitary pulmonary nodule: Secondary | ICD-10-CM | POA: Insufficient documentation

## 2012-02-08 LAB — COMPREHENSIVE METABOLIC PANEL (CC13)
ALT: 17 U/L (ref 0–55)
AST: 18 U/L (ref 5–34)
Albumin: 3.8 g/dL (ref 3.5–5.0)
Alkaline Phosphatase: 74 U/L (ref 40–150)
Glucose: 68 mg/dl — ABNORMAL LOW (ref 70–99)
Potassium: 4.6 mEq/L (ref 3.5–5.1)
Sodium: 138 mEq/L (ref 136–145)
Total Protein: 6.8 g/dL (ref 6.4–8.3)

## 2012-02-08 LAB — CBC WITH DIFFERENTIAL/PLATELET
BASO%: 1.1 % (ref 0.0–2.0)
Eosinophils Absolute: 0.2 10*3/uL (ref 0.0–0.5)
MCHC: 33.7 g/dL (ref 32.0–36.0)
MCV: 89.7 fL (ref 79.3–98.0)
MONO%: 5.6 % (ref 0.0–14.0)
NEUT#: 4.3 10*3/uL (ref 1.5–6.5)
RBC: 4.84 10*6/uL (ref 4.20–5.82)
RDW: 13.2 % (ref 11.0–14.6)
WBC: 6.8 10*3/uL (ref 4.0–10.3)

## 2012-02-08 MED ORDER — IOHEXOL 300 MG/ML  SOLN
100.0000 mL | Freq: Once | INTRAMUSCULAR | Status: AC | PRN
  Administered 2012-02-08: 100 mL via INTRAVENOUS

## 2012-02-10 ENCOUNTER — Telehealth: Payer: Self-pay | Admitting: Oncology

## 2012-02-10 ENCOUNTER — Ambulatory Visit: Payer: BC Managed Care – PPO | Admitting: Oncology

## 2012-02-10 NOTE — Telephone Encounter (Signed)
pt could not get off of job today and needed to reschedule.Leonard KitchenMarland KitchenDone Mar 03 2012

## 2012-03-03 ENCOUNTER — Ambulatory Visit (HOSPITAL_BASED_OUTPATIENT_CLINIC_OR_DEPARTMENT_OTHER): Payer: BC Managed Care – PPO | Admitting: Oncology

## 2012-03-03 VITALS — BP 127/74 | HR 76 | Temp 97.5°F | Resp 18 | Ht 72.0 in | Wt 161.5 lb

## 2012-03-03 DIAGNOSIS — R599 Enlarged lymph nodes, unspecified: Secondary | ICD-10-CM

## 2012-03-03 NOTE — Progress Notes (Signed)
Hematology and Oncology Follow Up Visit  [name] [id number] 1970-11-27 42 y.o. 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 This is likely reactive in nature.  Prior Therapy: He is S/P lymph node biopsy that was done on 11/03/2011 case number [id number] which showed follicular  Hyperplasia without any evidence of any malignancy   Interim History:  Mr. [name] presents today for a follow up visit. He is a very nice man I saw on 10/2011 for evaluation for pelvis lymph node enlargement. His biopsy discussed on the last visit was unremarkable. Since the last visit , he broke his left wrist and currently in a cast.  Clinically he is doing well. He does not report any viral illnesses, symptoms. Does not report any sore throat. Does not report anybody sick at home.    Medications: I have reviewed the patient's current medications. Current outpatient prescriptions:Ascorbic Acid (VITAMIN C) 100 MG tablet, Take 100 mg by mouth daily., Disp: , Rfl: ;  aspirin 81 MG tablet, Take 81 mg by mouth daily. Take [1] tablet 2-3 times weekly., Disp: , Rfl: ;  B Complex-C (B-COMPLEX WITH VITAMIN C) tablet, Take 1 tablet by mouth daily., Disp: , Rfl: ;  Garlic 1000 MG CAPS, Take by mouth., Disp: , Rfl:   Allergies: No Known Allergies  Past Medical History, Surgical history, Social history, and Family History were reviewed and updated.  Review of Systems: Constitutional:  Negative for fever, chills, night sweats, anorexia, weight loss, pain. Cardiovascular: no chest pain or dyspnea on exertion Respiratory: no cough, shortness of breath, or wheezing Neurological: no TIA or stroke symptoms Dermatological: negative ENT: negative Skin: Negative. Gastrointestinal: no abdominal pain, change in bowel habits, or black or bloody stools Genito-Urinary: negative Hematological and Lymphatic: negative Breast:  negative Musculoskeletal: negative Remaining ROS negative. Physical Exam: Blood pressure 127/74, pulse 76, temperature 97.5 F (36.4 C), temperature source Oral, resp. rate 18, height 6' (1.829 m), weight 161 lb 8 oz (73.256 kg). ECOG: 0 General appearance: alert Head: Normocephalic, without obvious abnormality, atraumatic Neck: no adenopathy, no carotid bruit, no JVD, supple, symmetrical, trachea midline and thyroid not enlarged, symmetric, no tenderness/mass/nodules Lymph nodes: Cervical, supraclavicular, and axillary nodes normal. Heart:regular rate and rhythm, S1, S2 normal, no murmur, click, rub or gallop Lung:chest clear, no wheezing, rales, normal symmetric air entry Abdomin: soft, non-tender, without masses or organomegaly EXT:no erythema, induration, or nodules   Lab Results: Lab Results  Component Value Date   WBC 6.8 02/08/2012   HGB 14.6 02/08/2012   HCT 43.4 02/08/2012   MCV 89.7 02/08/2012   PLT 215 02/08/2012     Chemistry      Component Value Date/Time   NA 138 02/08/2012 1237   NA 138 10/29/2011 1430   K 4.6 02/08/2012 1237   K 4.1 10/29/2011 1430   CL 102 02/08/2012 1237   CL 103 10/29/2011 1430   CO2 29 02/08/2012 1237   CO2 27 10/29/2011 1430   BUN 10.0 02/08/2012 1237   BUN 11 10/29/2011 1430   CREATININE 1.1 02/08/2012 1237   CREATININE 1.13 10/29/2011 1430      Component Value Date/Time   CALCIUM 9.2 02/08/2012 1237   CALCIUM 9.3 10/29/2011 1430   ALKPHOS 74 02/08/2012 1237   ALKPHOS 67 10/29/2011 1430   AST 18 02/08/2012 1237   AST 18 10/29/2011 1430   ALT 17 02/08/2012 1237   ALT 13 10/29/2011 1430   BILITOT  0.48 02/08/2012 1237   BILITOT 0.6 10/29/2011 1430     Radiological Studies: CT CHEST, ABDOMEN AND PELVIS WITH CONTRAST  Technique: Multidetector CT imaging of the chest, abdomen and  pelvis was performed following the standard protocol during bolus  administration of intravenous contrast.  Contrast: OMNIPAQUE IOHEXOL 300 MG/ML SOLN   Comparison: CT 09/08/2011  CT CHEST  Findings: No axillary or supraclavicular lymphadenopathy. No  mediastinal or hilar lymphadenopathy. No pericardial fluid.  Esophagus is normal.  Review of the lung parenchyma demonstrates ill-defined nodule in  the right middle lobe measuring 5 mm (image 35). Airways are  normal.  Impression:  1. No evidence of lymphoma recurrence within thorax.  2. Small 5 mm right lobe pulmonary nodule is probably benign.  Recommend attention on follow-up.  IMPRESSION:  CT ABDOMEN AND PELVIS  Findings: No focal hepatic lesion. The spleen appears unchanged  in volume compared to prior and is normal volume. The adrenal  glands and kidneys are normal. The stomach, small bowel, colon are  normal.  Abdominal aorta normal caliber. No periportal lymphadenopathy.  There is a left periaortic lymph node at the level of the renal  veins measuring 10 mm short axis slightly thickened from the 6 mm  on prior. Within the pelvis, the iliac lymphadenopathy has since  resolved with no pathologically enlarged lymph nodes remaining. No  new lymph nodes are present.  No evidence of mesenteric lymphadenopathy.  The prostate gland bladder normal. Review of bone windows  demonstrates no aggressive osseous lesions.  IMPRESSION:  1. Resolution of right iliac and inguinal lymphadenopathy.  2. Single borderline enlarged left periaortic lymph node is  increased in size compared to prior. Recommend attention on follow-  up.  3. Spleen is normal volume.    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 I do not really see any evidence of any malignancy as evident by the lymph node biopsy that was done on 11/03/2011 case number [id number].  His follow up CT scan and labs from 01/2012 were discussed today and showed no abnormal lymphadenopathy.  This appear to be reactive in nature and will not  require any further work up.  I will be happy to see him as needed.    Leonard Hose, MD 1/3/20144:04 PM

## 2013-01-04 ENCOUNTER — Other Ambulatory Visit: Payer: Self-pay

## 2013-03-20 ENCOUNTER — Encounter: Payer: Self-pay | Admitting: Internal Medicine

## 2013-03-20 ENCOUNTER — Ambulatory Visit (INDEPENDENT_AMBULATORY_CARE_PROVIDER_SITE_OTHER): Payer: BC Managed Care – PPO | Admitting: Internal Medicine

## 2013-03-20 VITALS — BP 129/76 | HR 100 | Temp 98.1°F | Resp 18

## 2013-03-20 DIAGNOSIS — M5410 Radiculopathy, site unspecified: Secondary | ICD-10-CM

## 2013-03-20 DIAGNOSIS — IMO0002 Reserved for concepts with insufficient information to code with codable children: Secondary | ICD-10-CM

## 2013-03-20 MED ORDER — PREDNISONE 20 MG PO TABS: 20.0000 mg | ORAL_TABLET | Freq: Two times a day (BID) | ORAL | Status: DC

## 2013-03-20 MED ORDER — HYDROCODONE-ACETAMINOPHEN 10-325 MG PO TABS: 1.0000 | ORAL_TABLET | ORAL | Status: DC | PRN

## 2013-03-20 MED ORDER — CYCLOBENZAPRINE HCL 5 MG PO TABS: ORAL_TABLET | ORAL | Status: DC

## 2013-03-20 NOTE — Progress Notes (Signed)
Pre-visit discussion using our clinic review tool, as applicable. No additional management support is needed unless otherwise documented below in the visit note.  

## 2013-03-20 NOTE — Patient Instructions (Signed)
I recommend a Physical Therapy consultation to determine optimal therapy.

## 2013-03-20 NOTE — Progress Notes (Signed)
Subjective:    Patient ID: [name], male    DOB: February 14, 1971, 43 y.o.   MRN: [id number]  HPI   His symptoms began 03/14/13 as he was getting out  of bed. He experienced a sudden "kink" in his back associated with some right foot numbness. 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  He had taken up to 800 mg of ibuprofen every 6 hrs throughout the weekend with no response.  After taking ibuprofen he did notice some diffuse headaches.  Since 1/16 he has also had some  R hip pain as well.  A neighbor gave him a Percocet , Phenergan, Flexeril, possibly Valium with some partial relief  Significant past medical history includes hospitalization for 6 days a week in [facility] with a possible ruptured disc at L4-L5.After that he saw a NS  He's had some chills and sweats since last Friday     Review of Systems He denies any chest or abdominal pain. He's had no change in bowels and  no melena or rectal bleeding.  He's had no dysuria, pyuria, hematuria.  He's had no incontinence of urine or stool  Other than the numbness in the foot on 1/14 has had no other weakness, numbness or tingling in the extremities. He has no balance disorder except for a related to the pain which has been constant  He's had no rash in the area of the pain has been no change in color or temperature of skin in this area.  Has history of abnormal bruising or bleeding.      Objective:   Physical Exam Gen.: In discomfort; well-nourished in appearance. Alert, appropriate and cooperative throughout exam.Appears younger than stated age  Head: Normocephalic without obvious abnormalities  Eyes: No corneal or conjunctival inflammation noted.No icterus Neck: No deformities, masses, or tenderness noted. Range of motion good but flexion causes back pain Lungs: Normal respiratory effort;  chest expands symmetrically. Lungs are clear to auscultation without rales,  or increased work of breathing. Rare wheeze Heart: Normal rate and rhythm. Normal S1 and S2. No gallop, click, or rub. No murmur. Abdomen: Bowel sounds normal; abdomen soft and nontender. No masses, organomegaly or hernias noted.                                   Musculoskeletal/extremities:  Reserve curvature of lower thoracic spine.  No clubbing, cyanosis, edema, or significant extremity  deformity noted. Range of motion normal .Tone & strength normal.  Severe pain across the lumbosacral area as he tries to lie flat on the exam table. Classic low back crawl he tries to rise from the table. Negative straight leg raising bilaterally. Elevation of the right leg to 75 did cause pain in the lumbosacral area to the right hip. Hand joints normal . Fingernail / toenail health good. Vascular: Carotid, radial artery, dorsalis pedis and  posterior tibial pulses are full and equal. No bruits present. Neurologic: Alert and oriented x3. Deep tendon reflex right knee 1/2+; left 0+. He has a broad and deliberate gait with rigid posture. He is able to complete heel and toe walking. Decrease sensation to light touch over the right foot. It       Skin: Intact without suspicious lesions or rashes. Lymph: No cervical, axillaryl lymphadenopathy present. Psych: Mood and  affect are normal. Normally interactive                                                                                        Assessment & Plan:  #1 acute low back syndrome in the context of a past history of ruptured disc at L4-L5. Slight straight leg raising and heel walking do not suggest ruptured disc at this time.  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

## 2013-04-02 ENCOUNTER — Telehealth: Payer: Self-pay | Admitting: Internal Medicine

## 2013-04-02 NOTE — Telephone Encounter (Signed)
Relevant patient education assigned to patient using Emmi. ° °

## 2013-09-20 ENCOUNTER — Emergency Department (HOSPITAL_COMMUNITY)
Admission: EM | Admit: 2013-09-20 | Discharge: 2013-09-20 | Disposition: A | Discharge status: Home / Self Care | Payer: BC Managed Care – PPO | Attending: Emergency Medicine | Admitting: Emergency Medicine

## 2013-09-20 ENCOUNTER — Encounter (HOSPITAL_COMMUNITY): Payer: Self-pay | Admitting: Emergency Medicine

## 2013-09-20 DIAGNOSIS — F17200 Nicotine dependence, unspecified, uncomplicated: Secondary | ICD-10-CM | POA: Insufficient documentation

## 2013-09-20 DIAGNOSIS — T6391XA Toxic effect of contact with unspecified venomous animal, accidental (unintentional), initial encounter: Secondary | ICD-10-CM | POA: Insufficient documentation

## 2013-09-20 DIAGNOSIS — Y929 Unspecified place or not applicable: Secondary | ICD-10-CM | POA: Insufficient documentation

## 2013-09-20 DIAGNOSIS — Y939 Activity, unspecified: Secondary | ICD-10-CM | POA: Insufficient documentation

## 2013-09-20 DIAGNOSIS — Z8719 Personal history of other diseases of the digestive system: Secondary | ICD-10-CM | POA: Insufficient documentation

## 2013-09-20 DIAGNOSIS — Z8659 Personal history of other mental and behavioral disorders: Secondary | ICD-10-CM | POA: Insufficient documentation

## 2013-09-20 DIAGNOSIS — T63441A Toxic effect of venom of bees, accidental (unintentional), initial encounter: Secondary | ICD-10-CM

## 2013-09-20 DIAGNOSIS — R011 Cardiac murmur, unspecified: Secondary | ICD-10-CM | POA: Insufficient documentation

## 2013-09-20 DIAGNOSIS — T63461A Toxic effect of venom of wasps, accidental (unintentional), initial encounter: Secondary | ICD-10-CM | POA: Insufficient documentation

## 2013-09-20 MED ORDER — DIPHENHYDRAMINE HCL 25 MG PO CAPS
25.0000 mg | ORAL_CAPSULE | Freq: Once | ORAL | Status: AC
  Administered 2013-09-20: 25 mg via ORAL
  Filled 2013-09-20: qty 1

## 2013-09-20 MED ORDER — OXYCODONE-ACETAMINOPHEN 5-325 MG PO TABS
1.0000 | ORAL_TABLET | Freq: Once | ORAL | Status: DC
  Filled 2013-09-20: qty 1

## 2013-09-20 NOTE — Discharge Instructions (Signed)

## 2013-09-20 NOTE — ED Notes (Signed)
Pt was stung by several yellow jackets approximately an hour ago on left side of head. Pt alert and oriented. Airway patent.nad noted.

## 2013-09-20 NOTE — ED Provider Notes (Signed)
CSN: [id number]     Arrival date & time 09/20/13  1559 History   First MD Initiated Contact with Patient 09/20/13 1610     Chief Complaint  Patient presents with  . Insect Bite     (Consider location/radiation/quality/duration/timing/severity/associated sxs/prior Treatment)  [name] is a 43 y.o. male who presents to the Emergency Department complaining of multiple stings to the left face and scalp.  Reports stepping on a yellow jacket nest by accident.  Unknown number of stings.  Incident occurred one hour prior to ED arrival.  Pt has not taken any benadryl or tried any therapies prior to arrival.  He denies difficulty swallowing, breathing, facial swelling, shortness of breath or chest pain.  No previous allergy rxn's to bee stings.     The history is provided by the patient.    Past Medical History  Diagnosis Date  . Depression   . Polysubstance abuse     10/26/2006 @ birth of son  STOPPED DRINKING-STATES NEVER HEAVY USER OF ALCOHOL  . Shortness of breath     pt is a smoker  . Heart murmur     NO SYMPTOMS  . GERD (gastroesophageal reflux disease)     TUMS IF NEEDED  . Weight loss     NIGHT SWEATS, LOSS OF APPETITE, NO ENERGY, LOSS OF WEIGHT,--? POSS LYMPHOMA--BIOSPY PLANNED  . Sleep apnea 10/29/11    STOP BANG SCORE OF 4   Past Surgical History  Procedure Laterality Date  . Knee surgery      RT KNEE ARTHROSCOPY  . Inguinal lymph node biopsy  11/03/11    Dr Brantley Stage   Family History  Problem Relation Age of Onset  . Colon cancer Maternal Grandfather    History  Substance Use Topics  . Smoking status: Current Every Day Smoker -- 1.00 packs/day for 10 years    Types: Cigarettes  . Smokeless tobacco: Never Used  . Alcohol Use: No     Comment:  quit 10/26/2006    Review of Systems  Constitutional: Negative for activity change and appetite change.  HENT: Negative for drooling, facial swelling, trouble swallowing and voice change.   Eyes: Negative for photophobia,  pain and visual disturbance.  Respiratory: Negative for cough, chest tightness, shortness of breath, wheezing and stridor.   Cardiovascular: Negative for chest pain.  Gastrointestinal: Negative for nausea, vomiting and abdominal pain.  Musculoskeletal: Negative for arthralgias, neck pain and neck stiffness.  Skin: Negative for color change, pallor and rash.  Neurological: Negative for dizziness, syncope, facial asymmetry, speech difficulty, weakness, numbness and headaches.  Psychiatric/Behavioral: Negative for confusion. The patient is not nervous/anxious.   All other systems reviewed and are negative.     Allergies  Review of patient's allergies indicates no known allergies.  Home Medications   Prior to Admission medications   Not on File   BP 129/89  Pulse 77  Temp(Src) 98.5 F (36.9 C) (Oral)  Resp 18  Ht 6' (1.829 m)  Wt 180 lb (81.647 kg)  BMI 24.41 kg/m2  SpO2 100% Physical Exam  Nursing note and vitals reviewed. Constitutional: He is oriented to person, place, and time. He appears well-developed and well-nourished. No distress.  HENT:  Head: Normocephalic and atraumatic.  Right Ear: Tympanic membrane and ear canal normal.  Left Ear: Tympanic membrane and ear canal normal.  Mouth/Throat: Uvula is midline, oropharynx is clear and moist and mucous membranes are normal. No trismus in the jaw. No uvula swelling.  Mild erythema noted  to the left external ear.  No significant edema.  Several erythematous papules to the left scalp.  No facial edema.    Eyes: Conjunctivae and EOM are normal. Pupils are equal, round, and reactive to light.  Neck: Normal range of motion. Neck supple.  Cardiovascular: Normal rate, regular rhythm, normal heart sounds and intact distal pulses.   No murmur heard. Pulmonary/Chest: Effort normal and breath sounds normal. No stridor. No respiratory distress.  Musculoskeletal: Normal range of motion.  Lymphadenopathy:    He has no cervical  adenopathy.  Neurological: He is alert and oriented to person, place, and time. He exhibits normal muscle tone. Coordination normal.  Skin: Skin is warm and dry.  Psychiatric: He has a normal mood and affect.    ED Course  Procedures (including critical care time) Labs Review Labs Reviewed - No data to display  Imaging Review No results found.   EKG Interpretation None      MDM   Final diagnoses:  Bee sting reaction, accidental or unintentional, initial encounter    Patient is feeling much better after ice and benadryl.  Airway remains patent and w/o edema.  Pt requesting discharge.  I have advised him to continue benadryl every 4-6 hrs.  Ice  and to return here for any worsening symptoms , pt agrees to plan and appears stable for d/c.     Greysyn Vanderberg L. Vanessa Sierraville, PA-C 09/20/13 1737

## 2013-09-22 NOTE — ED Provider Notes (Signed)
Medical screening examination/treatment/procedure(s) were performed by non-physician practitioner and as supervising physician I was immediately available for consultation/collaboration.   EKG Interpretation None       Virgel Manifold, MD 09/22/13 [phone]

## 2013-11-28 ENCOUNTER — Encounter: Payer: Self-pay | Admitting: Internal Medicine

## 2013-11-28 ENCOUNTER — Ambulatory Visit (INDEPENDENT_AMBULATORY_CARE_PROVIDER_SITE_OTHER): Payer: BC Managed Care – PPO | Admitting: Internal Medicine

## 2013-11-28 VITALS — BP 120/60 | HR 78 | Temp 98.0°F | Wt 172.2 lb

## 2013-11-28 DIAGNOSIS — L928 Other granulomatous disorders of the skin and subcutaneous tissue: Secondary | ICD-10-CM

## 2013-11-28 DIAGNOSIS — Z808 Family history of malignant neoplasm of other organs or systems: Secondary | ICD-10-CM

## 2013-11-28 DIAGNOSIS — D229 Melanocytic nevi, unspecified: Secondary | ICD-10-CM

## 2013-11-28 DIAGNOSIS — L980 Pyogenic granuloma: Secondary | ICD-10-CM

## 2013-11-28 DIAGNOSIS — D239 Other benign neoplasm of skin, unspecified: Secondary | ICD-10-CM

## 2013-11-28 NOTE — Patient Instructions (Signed)
Use your cell phone camera to monitor  the skin lesions (nevi). Take a photo of the skin lesions every 3 months with a small ruler immediately below the lesion to define any change in size, shape or color.

## 2013-11-28 NOTE — Progress Notes (Signed)
   Subjective:    Patient ID: [name], male    DOB: 29-Oct-1970, 43 y.o.   MRN: [id number]  HPI  He has noted a lesion over the right posterior neck  for a year. Intermittently it can be itchy or numb. There's been no associated pustule formation.  Recently he developed a second lesion over the upper back.  He has no exposure to young cats.  He has no constitutional symptoms  Of concern that his maternal grandfather had melanoma and died at age 22. His mother also had melanoma.  He did have inguinal lymph nodes removed by Dr. Alen Blew in 2014 because of persistent enlargement. 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    Review of Systems   Specifically no fever, chills, sweats, or weight loss       Objective:   Physical Exam  Positive or pertinent findings: Head shaven. Ear ring. Multiple tatoos. He has 2 pea-sized subcutaneous granulomas of the right posterior neck and right upper back. There is no evidence of cellulitis or pustular formation. He has no lymphadenopathy about the neck or axilla. He also has no organomegaly. He does have several minor nevi which do not appear to be melanomas.  General appearance :adequately nourished; in no distress. Eyes: No conjunctival inflammation or scleral icterus is present. Heart:  Normal rate and regular rhythm. S1 and S2 normal without gallop, murmur, click, rub or other extra sounds   Lungs:Chest clear to auscultation; no wheezes, rhonchi,rales ,or rubs present.No increased work of breathing.  Abdomen: bowel sounds normal, soft and non-tender without masses, organomegaly or hernias noted.  No guarding or rebound.  Vascular : all pulses equal ; no bruits present. Skin:Warm & dry.  Intact without suspicious lesions or rashes ; no jaundice or tenting          Assessment & Plan:  #1 two subcutaneous granulomas #2 thoracic nevi; no evidence of melanoma  Plan: Reassurance. He will establish with a dermatologist. The  skin lesions will be monitored with a cell phone photo with ruler every 3 months.

## 2013-11-28 NOTE — Progress Notes (Signed)
Pre visit review using our clinic review tool, if applicable. No additional management support is needed unless otherwise documented below in the visit note. 

## 2013-11-29 ENCOUNTER — Telehealth: Payer: Self-pay | Admitting: Internal Medicine

## 2013-11-29 DIAGNOSIS — Z808 Family history of malignant neoplasm of other organs or systems: Secondary | ICD-10-CM | POA: Insufficient documentation

## 2013-11-29 NOTE — Telephone Encounter (Signed)
emmi emailed °

## 2013-12-14 ENCOUNTER — Other Ambulatory Visit: Payer: Self-pay

## 2014-07-16 ENCOUNTER — Emergency Department (HOSPITAL_COMMUNITY): Payer: BLUE CROSS/BLUE SHIELD

## 2014-07-16 ENCOUNTER — Encounter (HOSPITAL_COMMUNITY): Payer: Self-pay

## 2014-07-16 ENCOUNTER — Emergency Department (HOSPITAL_COMMUNITY)
Admission: EM | Admit: 2014-07-16 | Discharge: 2014-07-16 | Disposition: A | Discharge status: Home / Self Care | Payer: BLUE CROSS/BLUE SHIELD | Attending: Emergency Medicine | Admitting: Emergency Medicine

## 2014-07-16 DIAGNOSIS — Z720 Tobacco use: Secondary | ICD-10-CM | POA: Diagnosis not present

## 2014-07-16 DIAGNOSIS — G4489 Other headache syndrome: Secondary | ICD-10-CM | POA: Insufficient documentation

## 2014-07-16 DIAGNOSIS — F419 Anxiety disorder, unspecified: Secondary | ICD-10-CM | POA: Insufficient documentation

## 2014-07-16 DIAGNOSIS — R51 Headache: Secondary | ICD-10-CM | POA: Diagnosis present

## 2014-07-16 DIAGNOSIS — K219 Gastro-esophageal reflux disease without esophagitis: Secondary | ICD-10-CM | POA: Diagnosis not present

## 2014-07-16 DIAGNOSIS — R011 Cardiac murmur, unspecified: Secondary | ICD-10-CM | POA: Insufficient documentation

## 2014-07-16 LAB — RAPID URINE DRUG SCREEN, HOSP PERFORMED
AMPHETAMINES: NOT DETECTED
BENZODIAZEPINES: NOT DETECTED
Barbiturates: NOT DETECTED
Cocaine: NOT DETECTED
OPIATES: NOT DETECTED
Tetrahydrocannabinol: POSITIVE — AB

## 2014-07-16 LAB — URINALYSIS, ROUTINE W REFLEX MICROSCOPIC
Bilirubin Urine: NEGATIVE
GLUCOSE, UA: NEGATIVE mg/dL
Hgb urine dipstick: NEGATIVE
KETONES UR: NEGATIVE mg/dL
LEUKOCYTES UA: NEGATIVE
Nitrite: NEGATIVE
Protein, ur: NEGATIVE mg/dL
SPECIFIC GRAVITY, URINE: 1.009 (ref 1.005–1.030)
Urobilinogen, UA: 0.2 mg/dL (ref 0.0–1.0)
pH: 7.5 (ref 5.0–8.0)

## 2014-07-16 LAB — PROTIME-INR
INR: 1.03 (ref 0.00–1.49)
Prothrombin Time: 13.6 seconds (ref 11.6–15.2)

## 2014-07-16 LAB — DIFFERENTIAL
Basophils Absolute: 0 10*3/uL (ref 0.0–0.1)
Basophils Relative: 1 % (ref 0–1)
Eosinophils Absolute: 0.1 10*3/uL (ref 0.0–0.7)
Eosinophils Relative: 2 % (ref 0–5)
LYMPHS ABS: 1.9 10*3/uL (ref 0.7–4.0)
LYMPHS PCT: 26 % (ref 12–46)
Monocytes Absolute: 0.5 10*3/uL (ref 0.1–1.0)
Monocytes Relative: 7 % (ref 3–12)
NEUTROS PCT: 64 % (ref 43–77)
Neutro Abs: 4.7 10*3/uL (ref 1.7–7.7)

## 2014-07-16 LAB — I-STAT CHEM 8, ED
BUN: 9 mg/dL (ref 6–20)
CHLORIDE: 99 mmol/L — AB (ref 101–111)
Calcium, Ion: 1.15 mmol/L (ref 1.12–1.23)
Creatinine, Ser: 1.2 mg/dL (ref 0.61–1.24)
GLUCOSE: 93 mg/dL (ref 65–99)
HCT: 50 % (ref 39.0–52.0)
HEMOGLOBIN: 17 g/dL (ref 13.0–17.0)
Potassium: 3.7 mmol/L (ref 3.5–5.1)
Sodium: 141 mmol/L (ref 135–145)
TCO2: 24 mmol/L (ref 0–100)

## 2014-07-16 LAB — ETHANOL

## 2014-07-16 LAB — BASIC METABOLIC PANEL
ANION GAP: 9 (ref 5–15)
BUN: 8 mg/dL (ref 6–20)
CALCIUM: 9.4 mg/dL (ref 8.9–10.3)
CO2: 28 mmol/L (ref 22–32)
Chloride: 100 mmol/L — ABNORMAL LOW (ref 101–111)
Creatinine, Ser: 1.29 mg/dL — ABNORMAL HIGH (ref 0.61–1.24)
GFR calc non Af Amer: >60 mL/min (ref >60)
Glucose, Bld: 95 mg/dL (ref 65–99)
Potassium: 3.9 mmol/L (ref 3.5–5.1)
Sodium: 137 mmol/L (ref 135–145)

## 2014-07-16 LAB — CBC
HEMATOCRIT: 46.3 % (ref 39.0–52.0)
Hemoglobin: 16 g/dL (ref 13.0–17.0)
MCH: 30.4 pg (ref 26.0–34.0)
MCHC: 34.6 g/dL (ref 30.0–36.0)
MCV: 88 fL (ref 78.0–100.0)
PLATELETS: 210 10*3/uL (ref 150–400)
RBC: 5.26 MIL/uL (ref 4.22–5.81)
RDW: 13.3 % (ref 11.5–15.5)
WBC: 7.4 10*3/uL (ref 4.0–10.5)

## 2014-07-16 LAB — I-STAT TROPONIN, ED: Troponin i, poc: 0 ng/mL (ref 0.00–0.08)

## 2014-07-16 LAB — APTT: aPTT: 32 seconds (ref 24–37)

## 2014-07-16 MED ORDER — GADOBENATE DIMEGLUMINE 529 MG/ML IV SOLN: 15.0000 mL | Freq: Once | INTRAVENOUS | Status: DC | PRN

## 2014-07-16 NOTE — ED Notes (Signed)
MD Aline Brochure notified of patient symptoms, stated he would be to bedside soon

## 2014-07-16 NOTE — ED Provider Notes (Signed)
CSN: [id number]     Arrival date & time 07/16/14  1441 History   First MD Initiated Contact with Patient 07/16/14 1512     Chief Complaint  Patient presents with  . Chest Pain     (Consider location/radiation/quality/duration/timing/severity/associated sxs/prior Treatment) HPI   [name] is a 44 y.o. male presents for evaluation of headache, paresthesias, weakness and blurred vision. Symptoms started, about 1 hour ago when he was working on a computer. The first thing he noticed, was blurred vision. He had to stop his computer work. He was feeling stressed at the time because of problems at work. He also noted tingling in both hands. These symptoms lasted about 45 minutes. He doesn't feel better, so he drove to a restaurant to get some food. While eating, he noticed some tingling in his left hand, and had return of the blurred vision. These symptoms continued while he drove to the hospital. On arrival, while walking and he felt weak as if his legs would buckle. He has noticed some sweatiness today with these symptoms. He also feels lightheaded and somewhat short of breath. 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 He has apparently not had follow-up for that. He is going through a stressful divorce at this time. He does not seek professional counseling. He states that yesterday when he went home he had a "nervous breakdown". This was also about stress at work. He states that he began crying yesterday. He is not currently drinking or using illegal drugs. He smokes cigarettes. He does not know his cholesterol level. No history of cardiac disease or stroke. There are no other known modifying factors.   Past Medical History  Diagnosis Date  . Depression   . Polysubstance abuse     10/26/2006 @ birth of son  STOPPED DRINKING-STATES NEVER HEAVY USER OF ALCOHOL  . Shortness of breath     pt is a smoker  . Heart murmur     NO  SYMPTOMS  . GERD (gastroesophageal reflux disease)     TUMS IF NEEDED  . Weight loss     NIGHT SWEATS, LOSS OF APPETITE, NO ENERGY, LOSS OF WEIGHT,--? POSS LYMPHOMA--BIOSPY PLANNED  . Sleep apnea 10/29/11    STOP BANG SCORE OF 4   Past Surgical History  Procedure Laterality Date  . Knee surgery      RT KNEE ARTHROSCOPY  . Inguinal lymph node biopsy  11/03/11    Dr Brantley Stage   Family History  Problem Relation Age of Onset  . Colon cancer Maternal Grandfather    History  Substance Use Topics  . Smoking status: Current Every Day Smoker -- 1.00 packs/day for 10 years    Types: Cigarettes  . Smokeless tobacco: Never Used  . Alcohol Use: No     Comment:  quit 10/26/2006    Review of Systems  All other systems reviewed and are negative.     Allergies  Review of patient's allergies indicates no known allergies.  Home Medications   Prior to Admission medications   Not on File   BP 117/82 mmHg  Pulse 61  Temp(Src) 98.5 F (36.9 C) (Oral)  Resp 17  Ht 6' (1.829 m)  Wt 170 lb (77.111 kg)  BMI 23.05 kg/m2  SpO2 100% Physical Exam  Constitutional: He is oriented to person, place, and time. He appears well-developed and well-nourished.  HENT:  Head: Normocephalic and atraumatic.  Right  Ear: External ear normal.  Left Ear: External ear normal.  Eyes: Conjunctivae and EOM are normal. Pupils are equal, round, and reactive to light.  Neck: Normal range of motion and phonation normal. Neck supple.  Cardiovascular: Normal rate, regular rhythm and normal heart sounds.   Pulmonary/Chest: Effort normal and breath sounds normal. He exhibits no bony tenderness.  Abdominal: Soft. There is no tenderness.  Musculoskeletal: Normal range of motion.  Neurological: He is alert and oriented to person, place, and time. No cranial nerve deficit or sensory deficit. He exhibits normal muscle tone. Coordination normal.  No dysarthria and aphasia or nystagmus. No ataxia. Normal finger-to-nose, and  heel-to-shin, bilaterally. Very mild decreased light touch and sedation of the right hand. No other focal sensation loss.  Skin: Skin is warm, dry and intact.  Psychiatric: His behavior is normal. Judgment and thought content normal.  Mildly anxious.  Nursing note and vitals reviewed.   ED Course  Procedures (including critical care time)  15:40- consideration for thrombolytics.- The patient does not meet criteria for thrombolysis.  Labs Review Labs Reviewed  BASIC METABOLIC PANEL - Abnormal; Notable for the following:    Chloride 100 (*)    Creatinine, Ser 1.29 (*)    All other components within normal limits  URINE RAPID DRUG SCREEN (HOSP PERFORMED) - Abnormal; Notable for the following:    Tetrahydrocannabinol POSITIVE (*)    All other components within normal limits  I-STAT CHEM 8, ED - Abnormal; Notable for the following:    Chloride 99 (*)    All other components within normal limits  CBC  ETHANOL  PROTIME-INR  APTT  URINALYSIS, ROUTINE W REFLEX MICROSCOPIC  DIFFERENTIAL  I-STAT TROPOININ, ED  Randolm Idol, ED    Imaging Review Mr Jeri Cos Wo Contrast  07/16/2014   CLINICAL DATA:  Headache and blurred vision today.  Paresthesia.  EXAM: MRI HEAD WITHOUT AND WITH CONTRAST  TECHNIQUE: Multiplanar, multiecho pulse sequences of the brain and surrounding structures were obtained without and with intravenous contrast.  CONTRAST:  17 mL MultiHance IV  COMPARISON:  CT head 04/24/2010  FINDINGS: Ventricle size is normal. Cerebral volume normal. Pituitary normal in size. Craniocervical junction normal. Negative for Chiari malformation.  Negative for acute or chronic infarction  Negative for demyelinating disease. Cerebral white matter normal. Basal ganglia and brainstem normal  Negative for intracranial hemorrhage or fluid collection.  Negative for mass or edema.  Normal enhancement following contrast infusion.  Minimal mucosal thickening in the paranasal sinuses bilaterally without  air-fluid level.  IMPRESSION: Normal MRI of the brain with contrast.  Mild chronic sinusitis.   Electronically Signed   By: Franchot Gallo M.D.   On: 07/16/2014 18:45   Dg Chest Port 1 View  07/16/2014   CLINICAL DATA:  Headaches for 1 week.  Vision loss for 45 minutes.  EXAM: PORTABLE CHEST - 1 VIEW  COMPARISON:  10/29/2011  FINDINGS: The heart size and mediastinal contours are within normal limits. Both lungs are clear. The visualized skeletal structures are unremarkable.  IMPRESSION: No active disease.   Electronically Signed   By: Kathreen Devoid   On: 07/16/2014 15:16     EKG Interpretation   Date/Time:  Tuesday Jul 16 2014 14:48:54 EDT Ventricular Rate:  61 PR Interval:  170 QRS Duration: 102 QT Interval:  410 QTC Calculation: 412 R Axis:   85 Text Interpretation:  Normal sinus rhythm Normal ECG since last tracing no  significant change Confirmed by Eulis Foster  MD, Blaze Sandin (  [id number]) on 07/16/2014  3:17:32 PM      MDM   Final diagnoses:  Other headache syndrome    Nonspecific headache, and paresthesias. No evidence for CVA, ACS or metabolic instability.  Nursing Notes Reviewed/ Care Coordinated Applicable Imaging Reviewed Interpretation of Laboratory Data incorporated into ED treatment  The patient appears reasonably screened and/or stabilized for discharge and I doubt any other medical condition or other [facility] requiring further screening, evaluation, or treatment in the ED at this time prior to discharge.  Plan: Home Medications- usual; Home Treatments- rest; return here if the recommended treatment, does not improve the symptoms; Recommended follow up- PCP prn     Daleen Bo, MD 07/17/14 [phone]

## 2014-07-16 NOTE — Discharge Instructions (Signed)
Follow-up for counseling with one of the behavioral health treatment centers listed below. They will be able to help you with stress management.    General Headache Without Cause A headache is pain or discomfort felt around the head or neck area. The specific cause of a headache may not be found. There are many causes and types of headaches. A few common ones are:  Tension headaches.  Migraine headaches.  Cluster headaches.  Chronic daily headaches. HOME CARE INSTRUCTIONS   Keep all follow-up appointments with your caregiver or any specialist referral.  Only take over-the-counter or prescription medicines for pain or discomfort as directed by your caregiver.  Lie down in a dark, quiet room when you have a headache.  Keep a headache journal to find out what may trigger your migraine headaches. For example, write down:  What you eat and drink.  How much sleep you get.  Any change to your diet or medicines.  Try massage or other relaxation techniques.  Put ice packs or heat on the head and neck. Use these 3 to 4 times per day for 15 to 20 minutes each time, or as needed.  Limit stress.  Sit up straight, and do not tense your muscles.  Quit smoking if you smoke.  Limit alcohol use.  Decrease the amount of caffeine you drink, or stop drinking caffeine.  Eat and sleep on a regular schedule.  Get 7 to 9 hours of sleep, or as recommended by your caregiver.  Keep lights dim if bright lights bother you and make your headaches worse. SEEK MEDICAL CARE IF:   You have problems with the medicines you were prescribed.  Your medicines are not working.  You have a change from the usual headache.  You have nausea or vomiting. SEEK IMMEDIATE MEDICAL CARE IF:   Your headache becomes severe.  You have a fever.  You have a stiff neck.  You have loss of vision.  You have muscular weakness or loss of muscle control.  You start losing your balance or have trouble  walking.  You feel faint or pass out.  You have severe symptoms that are different from your first symptoms. MAKE SURE YOU:   Understand these instructions.  Will watch your condition.  Will get help right away if you are not doing well or get worse. Document Released: 02/15/2005 Document Revised: 05/10/2011 Document Reviewed: 03/03/2011 [facility] Patient Information 2015 [place], [place]. This information is not intended to replace advice given to you by your health care provider. Make sure you discuss any questions you have with your health care provider.    Emergency Department Resource Guide 1) Find a Doctor and Pay Out of Pocket Although you won't have to find out who is covered by your insurance plan, it is a good idea to ask around and get recommendations. You will then need to call the office and see if the doctor you have chosen will accept you as a new patient and what types of options they offer for patients who are self-pay. Some doctors offer discounts or will set up payment plans for their patients who do not have insurance, but you will need to ask so you aren't surprised when you get to your appointment.  2) Contact Your Local Health Department Not all health departments have doctors that can see patients for sick visits, but many do, so it is worth a call to see if yours does. If you don't know where your local health department is, you  can check in your phone book. The CDC also has a tool to help you locate your state's health department, and many state websites also have listings of all of their local health departments.  3) Find a [facility] If your illness is not likely to be very severe or complicated, you may want to try a walk in clinic. These are popping up all over the country in pharmacies, drugstores, and shopping centers. They're usually staffed by nurse practitioners or physician assistants that have been trained to treat common illnesses and complaints.  They're usually fairly quick and inexpensive. However, if you have serious medical issues or chronic medical problems, these are probably not your best option.  No Primary Care Doctor: - Call Health Connect at  [phone] - they can help you locate a primary care doctor that  accepts your insurance, provides certain services, etc. - Physician Referral Service- [phone]  Chronic Pain Problems: Organization         Address  Phone   Notes  [facility]  [phone] Patients need to be referred by their primary care doctor.   Medication Assistance: Organization         Address  Phone   Notes  [facility] Medication [facility] [address] [place], [place] [address] [phone] --Must be a resident of [facility] -- Must have NO insurance coverage whatsoever (no Medicaid/ Medicare, etc.) -- The pt. MUST have a primary care doctor that directs their care regularly and follows them in the community   MedAssist  [phone]   [employer]  [phone]    Agencies that provide inexpensive medical care: Organization         Address  Phone   Notes  [facility]  [phone]   [facility]    [phone]   [facility] [place], [place] [address] [phone]   [place] [address], [place] [phone]   Planned Parenthood    [phone]   [facility]    [phone]   [employer] and [place] [address], [place] Phone:  [phone], Fax:  [phone] Hours of Operation:  9 am - 6 pm, M-F.  Also accepts Medicaid/Medicare and self-pay.  [facility] [place] for [place] [address], [address], [place] Phone: [phone], Fax: [phone]. Hours of Operation:  8:30 am - 5:30 pm, M-F.  Also accepts Medicaid and self-pay.  [facility] [place] [address], [place]  Phone: [phone]   [place], [place], [place] [phone], Ext. [phone] Mondays & Thursdays: 7-9 AM.  First 15 patients are seen on a first come, first serve basis.    [place] Providers:  Organization         Address  Phone   Notes  [facility] [address],  [phone] Also accepts self-pay patients.  [facility] [address], [place]  [phone]   [place], [address], [place] [phone]   [facility] [address] [facility] [address] [facility] [address] [place] [phone]   Lucianne Lei [address], [place]   [phone] Only accepts [place] Access [place] patients after they have their name applied  to their card.   Self-Pay (no insurance) in [facility]:  Organization         Address  Phone   Notes  Sickle Cell Patients, [facility] [place] [phone]   [facility] [place] [phone]   [facility] [place]  [address], [place],  [phone]   [facility]/Dr. Osei-Bonsu  [address], [place] or [address], [place] [phone] Phone number for both [place] and [place] locations is the same.  Urgent Medical and [facility] [address] [place] [phone]   [facility] [address] [place] or [address] [phone]   [facility] [address], [place] [phone], phone; [phone], fax Sees patients 1st and 3rd Saturday of every month.  Must not qualify for public or private insurance (i.e. Medicaid, Medicare, [place] Health Choice, Veterans' Benefits)  Household income should be no more than 200% of the poverty level The clinic cannot  treat you if you are pregnant or think you are pregnant  Sexually transmitted diseases are not treated at the clinic.    Dental Care: Organization         Address  Phone  Notes  [facility] Department of [facility] [place] [phone] Accepts children up to age 47 who are enrolled in [place] or [place]; pregnant women with a Medicaid card; and children who have applied for Medicaid or [place] Health Choice, but were declined, whose parents can pay a reduced fee at time of service.  [facility] Department of [facility]  [address], [place] [phone] Accepts children up to age 46 who are enrolled in [place] or [place]; pregnant women with a Medicaid card; and children who have applied for Medicaid or [place] Health Choice, but were declined, whose parents can pay a reduced fee at time of service.  [place] Adult [facility] Access PRO[facility]  [place] [phone] Patients are seen by appointment only. Walk-ins are not accepted. [place] will see patients 44 years of age and older. Monday - Tuesday (8am-5pm) Most Wednesdays (8:30-5pm) $30 per visit, cash only  [facility] Adult Dental Access PROGRAM  [address], [facility] [phone] Patients are seen by appointment only. Walk-ins are not accepted. [place] will see patients 92 years of age and older. One Wednesday Evening (Monthly: Volunteer Based).  $30 per visit, cash only  [place]  [phone] for adults; Children under age 42, call Graduate Pediatric Dentistry at [phone]. Children aged 29-14, please call [phone] to request a pediatric application.  Dental services are provided in all areas of dental care including fillings, crowns and bridges, complete and partial dentures, implants, gum treatment, root canals, and extractions. Preventive care is also provided.  Treatment is provided to both adults and children. Patients are selected via a lottery and there is often a waiting list.   [facility] [address], [place]  [phone] [email or web address]   [facility] [address] [place], [place] [phone], Ext. [phone] Second and Fourth Thursday of each month, opens at 6:30 AM; Clinic ends at 9 AM.  Patients are seen on a first-come first-served  basis, and a limited number are seen during each clinic.   [facility]  [address] Hillard Danker [place], [place] [phone]   Eligibility Requirements You must have lived in [place], [place], or [place] counties for at least the last three months.   You cannot be eligible for state or federal sponsored [employer], including [employer], [place], or [employer].   You generally cannot be eligible for healthcare insurance through your employer.    How to apply: Eligibility screenings are held every Tuesday and Wednesday afternoon from 1:00 pm until 4:00 pm. You do not need an appointment for the interview!  [facility] [address], [place], [place]   [place]  [place] Department  [place]  [phone]    Behavioral Health Resources in the Community: Intensive Outpatient Programs Organization         Address  Phone  Notes  [place]alton Tryon. [address], [place], [place] [phone]   [facility] [address], [place], [place]   ADS: Alcohol & Drug Svcs [address], [place], [place]   [place] [address]  [place], [place] or [phone]   Substance Abuse Resources Organization         Address  Phone  Notes  Alcohol and Drug Services  [phone]   [place]   [phone]   [facility] [place]   Chinita Pester  [phone]   Residential & Outpatient Substance Abuse Program  [phone]   Psychological Services Organization         Address  Phone  Notes  [facility] [place]  [place]  [phone]   [place] [address] [place] or [phone]    Mobile Crisis Teams Organization         Address  Phone  Notes  [facility], Mobile Crisis Care Unit  [phone]   [facility]  [address] [place], [place]   [place] [address], [place] [phone]    Self-Help/Support Groups Organization         Address  Phone             Notes  [place]. of [place] - variety of support groups  [place] Call for more information  Narcotics Anonymous (NA), [facility] [address], [employer] [place]  2 meetings at this location   Special educational needs teacher         Address  Phone  Notes  [facility] [place],    [place]  [phone]   [facility]  [address], [place] [address], [place], [place]   [place], [place] [phone] Admissions: 8am-3pm M-F  Incentives Substance [place] [address]    [place], [place] [phone]   [facility] [address] Jadene Pierini [place], [place]   [facility] [address],  [place], [place]   [facility] Programs - Intensive Outpatient [address] Krist[address], [place], [place]   [facility] ([place].) [place].,  [place], [place] or [phone]   [facility]idential Treatment Services (RTS) [address] [place], [place] Accepts Medicaid  [facility] [address],  [place] [phone] Substance  Abuse/Addiction Treatment   [facility] Organization         Address  Phone  Notes  [facility]  [phone]   Domenic Schwab, PhD [address] [place], [place]   [phone] or [phone]   [facility], [place] [phone]   [address], [place], [place] [phone] Insurance/Medicaid/sponsorship through [facility] and Families [address]                                    [employer], [place] [phone] [place] [address], [place] [phone]    Dr. Adele Schilder  [phone]   [facility] [place] Dept. 1) [address], [place] 2) [place] 3)  [place] [address], [place] [phone]  [phone]   [place] [phone] or [phone] (After Hours)

## 2014-07-16 NOTE — ED Notes (Signed)
Pt in stating about an hour ago he developed a headache, noticed vision changes, states he completely lost his vision and felt like he was going to pass out, then developed bilateral arm numbness, also developed chest pain, vision has improved at this time, continued tingling to bilateral hands, alert and oriented

## 2015-11-17 ENCOUNTER — Encounter (HOSPITAL_COMMUNITY): Payer: Self-pay | Admitting: Vascular Surgery

## 2015-11-17 ENCOUNTER — Emergency Department (HOSPITAL_COMMUNITY)
Admission: EM | Admit: 2015-11-17 | Discharge: 2015-11-17 | Disposition: A | Discharge status: Home / Self Care | Payer: BLUE CROSS/BLUE SHIELD | Attending: Emergency Medicine | Admitting: Emergency Medicine

## 2015-11-17 DIAGNOSIS — M5441 Lumbago with sciatica, right side: Secondary | ICD-10-CM | POA: Insufficient documentation

## 2015-11-17 DIAGNOSIS — F17210 Nicotine dependence, cigarettes, uncomplicated: Secondary | ICD-10-CM | POA: Diagnosis not present

## 2015-11-17 DIAGNOSIS — M545 Low back pain: Secondary | ICD-10-CM | POA: Diagnosis present

## 2015-11-17 MED ORDER — PREDNISONE 20 MG PO TABS: 40.0000 mg | ORAL_TABLET | Freq: Every day | ORAL | 0 refills | Status: DC

## 2015-11-17 MED ORDER — CYCLOBENZAPRINE HCL 10 MG PO TABS: 10.0000 mg | ORAL_TABLET | Freq: Every evening | ORAL | 0 refills | Status: DC | PRN

## 2015-11-17 NOTE — ED Triage Notes (Signed)
Pt reports to the ED for eval of low back pain. Pt reports he has hx of slipped disc. He states it has happened in the past but it will resolve however this time it has been persisting. Pt denies any tingling, paralysis, or bowel or bladder changes. Has some right leg numbness and pain radiates down his leg. Pt has had these symptoms x 5 days.

## 2015-11-17 NOTE — Discharge Instructions (Signed)
You can take 800mg  Ibuprofen three times daily. Ice and heat as needed

## 2015-11-17 NOTE — ED Notes (Signed)
Declined W/C at D/C and was escorted to lobby by RN. 

## 2015-11-18 NOTE — ED Provider Notes (Signed)
[facility] DEPT Provider Note   CSN: [id number] Arrival date & time: 11/17/15  1038     History   Chief Complaint Chief Complaint  Patient presents with  . Back Pain    HPI [name] is a 45 y.o. male who presents with low back pain radiating to his right thigh for the past 5 days. PMH significant for chronic low back pain. 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 He states he usually has flare up ~4 times a year and he can usually manage it with NSAIDs and the pain will get better in about three days however this time it has continued. The pain started when he reached over to grab something while seated. It is constant, worse with movement and walking. The pain radiates to his right leg and there is occasional paresthesias on the lateral leg. Denies fever, syncope, trauma, unexplained weight loss, loss of bowel/bladder function, hx of cancer, saddle anesthesia, urinary retention, IVDU. He states his pain is exactly the same as usual it just has lasted longer.   HPI  Past Medical History:  Diagnosis Date  . Depression   . GERD (gastroesophageal reflux disease)    TUMS IF NEEDED  . Heart murmur    NO SYMPTOMS  . Polysubstance abuse    10/26/2006 @ birth of son  STOPPED DRINKING-STATES NEVER HEAVY USER OF ALCOHOL  . Shortness of breath    pt is a smoker  . Sleep apnea 10/29/11   STOP BANG SCORE OF 4  . Weight loss    NIGHT SWEATS, LOSS OF APPETITE, NO ENERGY, LOSS OF WEIGHT,--? POSS LYMPHOMA--BIOSPY PLANNED    Patient Active Problem List   Diagnosis Date Noted  . Family history of melanoma 11/29/2013  . Sleep apnea, obstructive 10/30/2011  . Inguinal lymphadenopathy 10/27/2011    Past Surgical History:  Procedure Laterality Date  . inguinal lymph node biopsy  11/03/11   Dr Brantley Stage  . KNEE SURGERY     RT KNEE ARTHROSCOPY       Home Medications    Prior to Admission medications    Medication Sig Start Date End Date Taking? Authorizing Provider  cyclobenzaprine (FLEXERIL) 10 MG tablet Take 1 tablet (10 mg total) by mouth at bedtime and may repeat dose one time if needed. 11/17/15   Recardo Evangelist, PA-C  predniSONE (DELTASONE) 20 MG tablet Take 2 tablets (40 mg total) by mouth daily. 11/17/15   Recardo Evangelist, PA-C    Family History Family History  Problem Relation Age of Onset  . Colon cancer Maternal Grandfather     Social History Social History  Substance Use Topics  . Smoking status: Current Every Day Smoker    Packs/day: 1.00    Years: 10.00    Types: Cigarettes  . Smokeless tobacco: Never Used  . Alcohol use No     Comment:  quit 10/26/2006     Allergies   Review of patient's allergies indicates no known allergies.   Review of Systems Review of Systems  Constitutional: Negative for fever.  Musculoskeletal: Positive for back pain and gait problem.  Neurological: Positive for numbness. Negative for syncope.     Physical Exam Updated Vital Signs BP 108/68 (BP Location: Right Arm)   Pulse 64   Temp 98 F (36.7 C) (Oral)   Resp 18   Ht 5\' 11"  (1.803 m)   Wt 79.4 kg  SpO2 100%   BMI 24.41 kg/m   Physical Exam  Constitutional: He is oriented to person, place, and time. He appears well-developed and well-nourished. No distress.  HENT:  Head: Normocephalic and atraumatic.  Eyes: Conjunctivae are normal. Pupils are equal, round, and reactive to light. Right eye exhibits no discharge. Left eye exhibits no discharge. No scleral icterus.  Cardiovascular: Normal rate and regular rhythm.   No murmur heard. Pulmonary/Chest: Effort normal. No respiratory distress.  Abdominal: Soft. He exhibits no distension. There is no tenderness.  Musculoskeletal: He exhibits no edema.  Back: Inspection: No masses, deformity, or rash Palpation: Midline lumbar and sacral spinal tenderness. Right sided paraspinal muscle tenderness. Strength: 5/5 in lower  extremities and normal plantar and dorsiflexion Sensation: Intact sensation with light touch in lower extremities bilaterally Gait: Antalgic gait Reflexes: Patellar reflex is 2+ on left and 1+ on right, Achilles is 2+ bilaterally SLR: positive seated straight leg raise bilaterally  Neurological: He is alert and oriented to person, place, and time.  Skin: Skin is warm and dry.  Psychiatric: He has a normal mood and affect.  Nursing note and vitals reviewed.    ED Treatments / Results  Labs (all labs ordered are listed, but only abnormal results are displayed) Labs Reviewed - No data to display  EKG  EKG Interpretation None       Radiology No results found.  Procedures Procedures (including critical care time)  Medications Ordered in ED Medications - No data to display   Initial Impression / Assessment and Plan / ED Course  I have reviewed the triage vital signs and the nursing notes.  Pertinent labs & imaging results that were available during my care of the patient were reviewed by me and considered in my medical decision making (see chart for details).  Clinical Course   45 year old male with acute on chronic low back pain most likely due to disc herniation. Imaging not indicated presently as there are no red flag back pain signs/symptoms. Patient is ambulatory without weakness. Patient declines narcotic pain medicine but would like a trial of steroids and muscle relaxer. Advised NSAIDs and neurosurgery follow up if symptoms persist. Patient is NAD, non-toxic, with stable VS. Patient is informed of clinical course, understands medical decision making process, and agrees with plan. Opportunity for questions provided and all questions answered. Return precautions given.   Final Clinical Impressions(s) / ED Diagnoses   Final diagnoses:  Right-sided low back pain with right-sided sciatica    New Prescriptions Discharge Medication List as of 11/17/2015  1:24 PM      START taking these medications   Details  cyclobenzaprine (FLEXERIL) 10 MG tablet Take 1 tablet (10 mg total) by mouth at bedtime and may repeat dose one time if needed., Starting Mon 11/17/2015, Print    predniSONE (DELTASONE) 20 MG tablet Take 2 tablets (40 mg total) by mouth daily., Starting Mon 11/17/2015, Print         Recardo Evangelist, PA-C 11/18/15 1820    Carmin Muskrat, MD 11/20/15 1655

## 2016-06-08 ENCOUNTER — Encounter (HOSPITAL_COMMUNITY): Payer: Self-pay | Admitting: Emergency Medicine

## 2016-06-08 ENCOUNTER — Emergency Department (HOSPITAL_COMMUNITY)
Admission: EM | Admit: 2016-06-08 | Discharge: 2016-06-08 | Disposition: A | Discharge status: Home / Self Care | Payer: BLUE CROSS/BLUE SHIELD | Attending: Emergency Medicine | Admitting: Emergency Medicine

## 2016-06-08 DIAGNOSIS — M545 Low back pain: Secondary | ICD-10-CM | POA: Diagnosis present

## 2016-06-08 DIAGNOSIS — M5442 Lumbago with sciatica, left side: Secondary | ICD-10-CM | POA: Diagnosis not present

## 2016-06-08 DIAGNOSIS — F17210 Nicotine dependence, cigarettes, uncomplicated: Secondary | ICD-10-CM | POA: Diagnosis not present

## 2016-06-08 DIAGNOSIS — M5441 Lumbago with sciatica, right side: Secondary | ICD-10-CM

## 2016-06-08 HISTORY — DX: Unspecified injury of lower back, initial encounter: S39.92XA

## 2016-06-08 MED ORDER — CYCLOBENZAPRINE HCL 10 MG PO TABS
10.0000 mg | ORAL_TABLET | Freq: Once | ORAL | Status: AC
  Administered 2016-06-08: 10 mg via ORAL
  Filled 2016-06-08: qty 1

## 2016-06-08 MED ORDER — PREDNISONE 20 MG PO TABS
40.0000 mg | ORAL_TABLET | Freq: Once | ORAL | Status: AC
  Administered 2016-06-08: 40 mg via ORAL
  Filled 2016-06-08: qty 2

## 2016-06-08 MED ORDER — CYCLOBENZAPRINE HCL 10 MG PO TABS: 10.0000 mg | ORAL_TABLET | Freq: Every evening | ORAL | 0 refills | Status: DC | PRN

## 2016-06-08 MED ORDER — PREDNISONE 20 MG PO TABS: 40.0000 mg | ORAL_TABLET | Freq: Every day | ORAL | 0 refills | Status: DC

## 2016-06-08 NOTE — ED Triage Notes (Signed)
Injured "L4 and L5" 8 yrs ago. c/o pain to lower back radiating down right leg and leg goes numb. nad. Took ibuprofen for pain yesterday with no relief.

## 2016-06-08 NOTE — Discharge Instructions (Signed)
Take steroid for the next 5 days. You have already received one dose today Take Flexeril as needed Take Ibuprofen 3-4 times a day with food Continue ice/heat Return for worsening symptoms

## 2016-06-08 NOTE — ED Notes (Signed)
Assisted pt to bed from wheelchair

## 2016-06-08 NOTE — ED Provider Notes (Signed)
[facility] DEPT Provider Note   CSN: [id number] Arrival date & time: 06/08/16  0945     History   Chief Complaint Chief Complaint  Patient presents with  . Back Pain    HPI [name] is a 46 y.o. male who presents with acute on chronic low back pain. He states the pain started 2 days ago after he "twisted" it. The pain radiates to both legs however is worse on the right side. He reports associated numbness of the right leg. 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 He was last seen by me in September for the same. He states that after prednisone, muscle relaxers, NSAIDs the pain usually gets better after a couple days. It is constant, worse with movement and walking. Denies fever, syncope, trauma, unexplained weight loss, loss of bowel/bladder function, hx of cancer, saddle anesthesia, urinary retention, IVDU. He has been seen by multiple doctors in the past but does not want surgery. He has also tried multiple rounds of physical therapy which sometimes help.  HPI  Past Medical History:  Diagnosis Date  . Back injury   . Depression   . GERD (gastroesophageal reflux disease)    TUMS IF NEEDED  . Heart murmur    NO SYMPTOMS  . Polysubstance abuse    10/26/2006 @ birth of son  STOPPED DRINKING-STATES NEVER HEAVY USER OF ALCOHOL  . Shortness of breath    pt is a smoker  . Sleep apnea 10/29/11   STOP BANG SCORE OF 4  . Weight loss    NIGHT SWEATS, LOSS OF APPETITE, NO ENERGY, LOSS OF WEIGHT,--? POSS LYMPHOMA--BIOSPY PLANNED    Patient Active Problem List   Diagnosis Date Noted  . Family history of melanoma 11/29/2013  . Sleep apnea, obstructive 10/30/2011  . Inguinal lymphadenopathy 10/27/2011    Past Surgical History:  Procedure Laterality Date  . inguinal lymph node biopsy  11/03/11   Dr Brantley Stage  . KNEE SURGERY     RT KNEE ARTHROSCOPY       Home Medications    Prior to Admission  medications   Medication Sig Start Date End Date Taking? Authorizing Provider  cyclobenzaprine (FLEXERIL) 10 MG tablet Take 1 tablet (10 mg total) by mouth at bedtime and may repeat dose one time if needed. 06/08/16   Recardo Evangelist, PA-C  predniSONE (DELTASONE) 20 MG tablet Take 2 tablets (40 mg total) by mouth daily. 06/08/16   Recardo Evangelist, PA-C    Family History Family History  Problem Relation Age of Onset  . Colon cancer Maternal Grandfather     Social History Social History  Substance Use Topics  . Smoking status: Current Every Day Smoker    Packs/day: 1.00    Years: 10.00    Types: Cigarettes  . Smokeless tobacco: Never Used  . Alcohol use No     Comment:  quit 10/26/2006     Allergies   Patient has no known allergies.   Review of Systems Review of Systems  Constitutional: Negative for fever.  Genitourinary: Negative for difficulty urinating and enuresis.  Musculoskeletal: Positive for back pain and myalgias.  Neurological: Positive for weakness and numbness.     Physical Exam Updated Vital Signs BP 125/74 (BP Location: Right Arm)   Pulse 62   Temp 98 F (36.7 C) (Oral)   Resp 17   Ht 6' (1.829 m)   Wt 81.6  kg   SpO2 100%   BMI 24.41 kg/m   Physical Exam  Constitutional: He is oriented to person, place, and time. He appears well-developed and well-nourished. No distress.  HENT:  Head: Normocephalic and atraumatic.  Eyes: Conjunctivae are normal. Pupils are equal, round, and reactive to light. Right eye exhibits no discharge. Left eye exhibits no discharge. No scleral icterus.  Neck: Normal range of motion.  Cardiovascular: Normal rate.   Pulmonary/Chest: Effort normal. No respiratory distress.  Abdominal: He exhibits no distension.  Musculoskeletal:  Back: Inspection: No masses, deformity, or rash Palpation: Lumbar midline spinal tenderness. Right sided paraspinal muscle tenderness. Strength: 5/5 in lower extremities and normal plantar and  dorsiflexion Sensation: Subjective decreased sensation Reflexes: Patellar reflex is 2+ bilaterally SLR: Positive seated straight leg raise bilaterally Gait: Antalgic gait   Neurological: He is alert and oriented to person, place, and time.  Skin: Skin is warm and dry.  Psychiatric: He has a normal mood and affect. His behavior is normal.  Nursing note and vitals reviewed.    ED Treatments / Results  Labs (all labs ordered are listed, but only abnormal results are displayed) Labs Reviewed - No data to display  EKG  EKG Interpretation None       Radiology No results found.  Procedures Procedures (including critical care time)  Medications Ordered in ED Medications  predniSONE (DELTASONE) tablet 40 mg (40 mg Oral Given 06/08/16 1137)  cyclobenzaprine (FLEXERIL) tablet 10 mg (10 mg Oral Given 06/08/16 1137)     Initial Impression / Assessment and Plan / ED Course  I have reviewed the triage vital signs and the nursing notes.  Pertinent labs & imaging results that were available during my care of the patient were reviewed by me and considered in my medical decision making (see chart for details).  46 year old male presents with acute on chronic back pain. No red flags in history or on exam. Imaging not indicated. Will give steroids and muscle relaxers in the ED. Prescription for the same given. Return precautions given.  Final Clinical Impressions(s) / ED Diagnoses   Final diagnoses:  Acute right-sided low back pain with bilateral sciatica    New Prescriptions Discharge Medication List as of 06/08/2016 11:36 AM       Recardo Evangelist, PA-C 06/08/16 [place], MD 06/11/16 0532

## 2017-10-22 ENCOUNTER — Encounter (HOSPITAL_COMMUNITY): Payer: Self-pay

## 2017-10-22 ENCOUNTER — Other Ambulatory Visit: Payer: Self-pay

## 2017-10-22 ENCOUNTER — Emergency Department (HOSPITAL_COMMUNITY): Payer: Self-pay

## 2017-10-22 ENCOUNTER — Emergency Department (HOSPITAL_COMMUNITY)
Admission: EM | Admit: 2017-10-22 | Discharge: 2017-10-23 | Disposition: A | Discharge status: Home / Self Care | Payer: Self-pay | Attending: Emergency Medicine | Admitting: Emergency Medicine

## 2017-10-22 DIAGNOSIS — R509 Fever, unspecified: Secondary | ICD-10-CM | POA: Insufficient documentation

## 2017-10-22 DIAGNOSIS — R079 Chest pain, unspecified: Secondary | ICD-10-CM | POA: Insufficient documentation

## 2017-10-22 DIAGNOSIS — F17210 Nicotine dependence, cigarettes, uncomplicated: Secondary | ICD-10-CM | POA: Insufficient documentation

## 2017-10-22 DIAGNOSIS — F419 Anxiety disorder, unspecified: Secondary | ICD-10-CM | POA: Insufficient documentation

## 2017-10-22 DIAGNOSIS — R202 Paresthesia of skin: Secondary | ICD-10-CM | POA: Insufficient documentation

## 2017-10-22 DIAGNOSIS — L723 Sebaceous cyst: Secondary | ICD-10-CM | POA: Insufficient documentation

## 2017-10-22 DIAGNOSIS — J322 Chronic ethmoidal sinusitis: Secondary | ICD-10-CM | POA: Insufficient documentation

## 2017-10-22 LAB — URINALYSIS, ROUTINE W REFLEX MICROSCOPIC
BACTERIA UA: NONE SEEN
Bilirubin Urine: NEGATIVE
Glucose, UA: NEGATIVE mg/dL
KETONES UR: NEGATIVE mg/dL
NITRITE: NEGATIVE
Protein, ur: NEGATIVE mg/dL
SPECIFIC GRAVITY, URINE: 1.019 (ref 1.005–1.030)
pH: 8 (ref 5.0–8.0)

## 2017-10-22 LAB — BASIC METABOLIC PANEL
Anion gap: 6 (ref 5–15)
BUN: 14 mg/dL (ref 6–20)
CHLORIDE: 107 mmol/L (ref 98–111)
CO2: 22 mmol/L (ref 22–32)
Calcium: 8.1 mg/dL — ABNORMAL LOW (ref 8.9–10.3)
Creatinine, Ser: 1.33 mg/dL — ABNORMAL HIGH (ref 0.61–1.24)
GFR calc Af Amer: >60 mL/min (ref >60)
GFR calc non Af Amer: >60 mL/min (ref >60)
Glucose, Bld: 104 mg/dL — ABNORMAL HIGH (ref 70–99)
Potassium: 3.8 mmol/L (ref 3.5–5.1)
SODIUM: 135 mmol/L (ref 135–145)

## 2017-10-22 LAB — CBC
HCT: 41.7 % (ref 39.0–52.0)
Hemoglobin: 14.1 g/dL (ref 13.0–17.0)
MCH: 30.4 pg (ref 26.0–34.0)
MCHC: 33.8 g/dL (ref 30.0–36.0)
MCV: 89.9 fL (ref 78.0–100.0)
Platelets: 168 10*3/uL (ref 150–400)
RBC: 4.64 MIL/uL (ref 4.22–5.81)
RDW: 13.2 % (ref 11.5–15.5)
WBC: 8.7 10*3/uL (ref 4.0–10.5)

## 2017-10-22 LAB — RAPID URINE DRUG SCREEN, HOSP PERFORMED
Amphetamines: NOT DETECTED
Barbiturates: NOT DETECTED
Benzodiazepines: POSITIVE — AB
Cocaine: NOT DETECTED
Opiates: NOT DETECTED
Tetrahydrocannabinol: POSITIVE — AB

## 2017-10-22 LAB — TROPONIN I: Troponin I: <0.03 ng/mL (ref <0.03)

## 2017-10-22 MED ORDER — KETOROLAC TROMETHAMINE 30 MG/ML IJ SOLN
30.0000 mg | Freq: Once | INTRAMUSCULAR | Status: AC
  Administered 2017-10-22: 30 mg via INTRAVENOUS
  Filled 2017-10-22: qty 1

## 2017-10-22 MED ORDER — SODIUM CHLORIDE 0.9 % IV BOLUS
1000.0000 mL | Freq: Once | INTRAVENOUS | Status: AC
  Administered 2017-10-22: 1000 mL via INTRAVENOUS

## 2017-10-22 NOTE — ED Provider Notes (Signed)
[facility] EMERGENCY DEPARTMENT Provider Note   CSN: [id number] Arrival date & time: 10/22/17  2049     History   Chief Complaint Chief Complaint  Patient presents with  . Chest Pain    palpatations    HPI [name] is a 47 y.o. male.  HPI     He presents for evaluation of a constellation of symptoms including severe throbbing headache, fever, feeling of dying, bilateral shoulder and arm numbness, chest discomfort, cough, shortness of breath, nausea without vomiting or diarrhea, and difficulty walking.  The headache started 2 PM today when he was at a birthday party at [place].  After that he only took a nap.  Later he awoke and had persistent symptoms so came here.  No history of similar problems.  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  He attributes his cough to smoking.  He occasionally produces green to brown sputum.  No known sick contacts.  His family members report that there is a history of prolonged QT and several family members.  He has never been diagnosed with it.  He has not had any syncope.  He noticed a fluttering in his chest both at home, and while here in the ED.  Modifying factors.  Past Medical History:  Diagnosis Date  . Back injury   . Depression   . GERD (gastroesophageal reflux disease)    TUMS IF NEEDED  . Heart murmur    NO SYMPTOMS  . Polysubstance abuse ([place])    10/26/2006 @ birth of son  STOPPED DRINKING-STATES NEVER HEAVY USER OF ALCOHOL  . Shortness of breath    pt is a smoker  . Sleep apnea 10/29/11   STOP BANG SCORE OF 4  . Weight loss    NIGHT SWEATS, LOSS OF APPETITE, NO ENERGY, LOSS OF WEIGHT,--? POSS LYMPHOMA--BIOSPY PLANNED    Patient Active Problem List   Diagnosis Date Noted  . Family history of melanoma 11/29/2013  . Sleep apnea, obstructive 10/30/2011  . Inguinal lymphadenopathy 10/27/2011    Past Surgical History:  Procedure  Laterality Date  . inguinal lymph node biopsy  11/03/11   Dr Brantley Stage  . KNEE SURGERY     RT KNEE ARTHROSCOPY        Home Medications    Prior to Admission medications   Medication Sig Start Date End Date Taking? Authorizing Provider  fluticasone (FLONASE) 50 MCG/ACT nasal spray Place 2 sprays into both nostrils daily. 10/23/17   Daleen Bo, MD    Family History Family History  Problem Relation Age of Onset  . Colon cancer Maternal Grandfather     Social History Social History   Tobacco Use  . Smoking status: Current Every Day Smoker    Packs/day: 1.00    Years: 10.00    Pack years: 10.00    Types: Cigarettes  . Smokeless tobacco: Never Used  Substance Use Topics  . Alcohol use: No    Comment:  quit 10/26/2006  . Drug use: No     Allergies   Patient has no known allergies.   Review of Systems Review of Systems  All other systems reviewed and are negative.    Physical Exam Updated Vital Signs BP 118/62   Pulse 72   Temp 100 F (37.8 C) (Oral)   Resp (!) 23   Ht 6' (1.829 m)   Wt 81.6 kg  SpO2 97%   BMI 24.41 kg/m   Physical Exam  Constitutional: He is oriented to person, place, and time. He appears well-developed and well-nourished. He appears ill (He is uncomfortable).  HENT:  Head: Normocephalic and atraumatic.  Right Ear: External ear normal.  Left Ear: External ear normal.  Eyes: Pupils are equal, round, and reactive to light. Conjunctivae and EOM are normal.  Neck: Normal range of motion and phonation normal. Neck supple.  There is no meningismus.  Cardiovascular: Normal rate, regular rhythm and normal heart sounds.  Pulmonary/Chest: Effort normal and breath sounds normal. He exhibits no bony tenderness.  Abdominal: Soft. There is no tenderness.  Musculoskeletal: Normal range of motion.  Neurological: He is alert and oriented to person, place, and time. No cranial nerve deficit or sensory deficit. He exhibits normal muscle tone.  Coordination normal.  No dysarthria, aphasia or nystagmus.  No pronator drift.  No ataxia.  Normal finger-to-nose and heel-to-shin bilaterally.  Skin: Skin is warm, dry and intact.  Psychiatric: His behavior is normal. Judgment and thought content normal.  He is anxious  Nursing note and vitals reviewed.    ED Treatments / Results  Labs (all labs ordered are listed, but only abnormal results are displayed) Labs Reviewed  BASIC METABOLIC PANEL - Abnormal; Notable for the following components:      Result Value   Glucose, Bld 104 (*)    Creatinine, Ser 1.33 (*)    Calcium 8.1 (*)    All other components within normal limits  URINALYSIS, ROUTINE W REFLEX MICROSCOPIC - Abnormal; Notable for the following components:   Hgb urine dipstick SMALL (*)    Leukocytes, UA TRACE (*)    All other components within normal limits  RAPID URINE DRUG SCREEN, HOSP PERFORMED - Abnormal; Notable for the following components:   Benzodiazepines POSITIVE (*)    Tetrahydrocannabinol POSITIVE (*)    All other components within normal limits  CBC  TROPONIN I    EKG EKG Interpretation  Date/Time:  Saturday October 22 2017 20:55:00 EDT Ventricular Rate:  97 PR Interval:    QRS Duration: 107 QT Interval:  329 QTC Calculation: 418 R Axis:   68 Text Interpretation:  Sinus rhythm since last tracing no significant change Confirmed by Daleen Bo [phone]) on 10/22/2017 9:34:14 PM   Radiology Dg Chest 2 View  Result Date: 10/22/2017 CLINICAL DATA:  Chest pain, intermittent for 3 weeks with radiation to LEFT shoulder and shoulder blade. Shortness of breath. Today with bilateral hands tingling, headache, low-grade fever. Chronic productive cough. Smoker. EXAM: CHEST - 2 VIEW COMPARISON:  Chest x-ray dated 07/16/2014. FINDINGS: Heart size and mediastinal contours are within normal limits. Chronic bronchitic changes centrally. Lungs otherwise unremarkable. No pleural effusion. No acute or suspicious osseous  finding. IMPRESSION: 1. No active cardiopulmonary disease. No evidence of pneumonia or pulmonary edema. 2. Chronic bronchitic changes. Electronically Signed   By: Franki Cabot M.D.   On: 10/22/2017 21:22   Ct Head Wo Contrast  Addendum Date: 10/22/2017   ADDENDUM REPORT: 10/22/2017 22:18 ADDENDUM: Similar mucosal thickening demonstrated on head CT of 04/24/2010, suggesting chronic sinus disease. Electronically Signed   By: Franki Cabot M.D.   On: 10/22/2017 22:18   Result Date: 10/22/2017 CLINICAL DATA:  Pt reports bilateral hands tingling.  Headache. EXAM: CT HEAD WITHOUT CONTRAST TECHNIQUE: Contiguous axial images were obtained from the base of the skull through the vertex without intravenous contrast. COMPARISON:  Head CT dated 04/24/2010.  Brain MRI dated 07/16/2014.  FINDINGS: Brain: Ventricles are within normal limits in size and configuration. All areas of the brain demonstrate appropriate gray-white matter differentiation. There is no mass, hemorrhage, edema or other evidence of acute parenchymal abnormality. No extra-axial hemorrhage. Vascular: No hyperdense vessel or unexpected calcification. Skull: Normal. Negative for fracture or focal lesion. Sinuses/Orbits: Scattered foci of mucosal thickening, most prominent within the ethmoid air cells, of uncertain chronicity. Periorbital and retro-orbital soft tissues are unremarkable. Other: None. IMPRESSION: 1. No acute intracranial abnormality. No intracranial mass, hemorrhage or edema. 2. Ethmoid sinus disease, of uncertain chronicity. Electronically Signed: By: Franki Cabot M.D. On: 10/22/2017 22:14    Procedures Procedures (including critical care time)  Medications Ordered in ED Medications  sodium chloride 0.9 % bolus 1,000 mL (0 mLs Intravenous Stopped 10/22/17 2238)  ketorolac (TORADOL) 30 MG/ML injection 30 mg (30 mg Intravenous Given 10/22/17 2238)     Initial Impression / Assessment and Plan / ED Course  I have reviewed the triage  vital signs and the nursing notes.  Pertinent labs & imaging results that were available during my care of the patient were reviewed by me and considered in my medical decision making (see chart for details).  Clinical Course as of Oct 24 10  Sat Oct 22, 2017  2312 No intracranial abnormality, images reviewed by me  CT Head Wo Contrast [EW]  2313 No infiltrate or CHF, images reviewed by me  DG Chest 2 View [EW]  2333 He states that his headache is better after the Toradol.  He now complains of 2 nodules on his lower neck.  These were evaluated by me he has 2 small firm nodules about 1 x 2 cm, in the midline of his lower neck and upper back.  These have the consistency of sebaceous cysts.  States these have been present for many months and seem to be getting somewhat bigger.   [EW]  Sun Oct 23, 2017  0012 Abnormal, positive for benzodiazepines which are not prescribed and THC  Urine rapid drug screen (hosp performed)(!) [EW]    Clinical Course User Index [EW] Daleen Bo, MD     Patient Vitals for the past 24 hrs:  BP Temp Temp src Pulse Resp SpO2 Height Weight  10/22/17 2300 118/62 - - 72 (!) 23 97 % - -  10/22/17 2232 - 100 F (37.8 C) Oral - - - - -  10/22/17 2200 112/80 - - 78 16 98 % - -  10/22/17 2130 118/75 - - 90 19 99 % - -  10/22/17 2056 125/85 99 F (37.2 C) Oral 93 12 99 % - -  10/22/17 2055 - - - - - - 6' (1.829 m) 81.6 kg  10/22/17 2053 125/85 99 F (37.2 C) Oral 97 18 100 % - -  10/22/17 2052 125/85 - - - - - - -    12:12 AM Reevaluation with update and discussion. After initial assessment and treatment, an updated evaluation reveals no change in clinical status.  He reports that his headache has resolved.  I discussed the findings with the patient and family members were in the room.  As I did this patient became more agitated and anxious, and started to take off his gown and stating he needed to leave immediately.  I offered additional counsel and discussion,  and he stated he had no additional concerns. Daleen Bo   Medical Decision Making: Nonspecific constellation of symptoms with reassuring evaluation.  Incidental chronic ethmoid sinusitis.  Patient with moderate  anxiety.  Noted benzodiazepine on drug screen which is not prescribed, worrisome for illicit use.  Doubt meningitis, encephalitis, serious bacterial infection, metabolic instability or impending vascular collapse.  Occasion further ED treatment, intervention or hospitalization.  CRITICAL CARE-no Performed by: Daleen Bo   Nursing Notes Reviewed/ Care Coordinated Applicable Imaging Reviewed Interpretation of Laboratory Data incorporated into ED treatment  The patient appears reasonably screened and/or stabilized for discharge and I doubt any other medical condition or other [facility] requiring further screening, evaluation, or treatment in the ED at this time prior to discharge.  Plan: Home Medications-OTC antipyretic/analgesic of choice; Home Treatments-rest, fluids, gradually advance diet; return here if the recommended treatment, does not improve the symptoms; Recommended follow up-PCP follow-up 2 weeks for checkup and further treatment as needed.    Final Clinical Impressions(s) / ED Diagnoses   Final diagnoses:  Nonspecific chest pain  Paresthesia  Chronic ethmoidal sinusitis  Anxiety  Sebaceous cyst  Fever, unspecified fever cause    ED Discharge Orders         Ordered    fluticasone (FLONASE) 50 MCG/ACT nasal spray  Daily     10/23/17 0008           Daleen Bo, MD 10/23/17 [phone]

## 2017-10-22 NOTE — ED Triage Notes (Signed)
Pt reports chest pain/palpatations/fluttering with radiation to left shoulder and shoulder blade. Pt reports bilateral hands tingling. Pt has low grade fever 100.6 reported by EMS. Pt had total of 1000 mg Tylenol PTA.

## 2017-10-23 MED ORDER — FLUTICASONE PROPIONATE 50 MCG/ACT NA SUSP: 2.0000 | Freq: Every day | NASAL | 0 refills | Status: AC

## 2017-10-23 NOTE — Discharge Instructions (Addendum)
There were no serious causes found for your symptoms tonight.  You likely have a virus causing the fever, with chronic sinusitis.  We checked your brain and heart for problems associated with the numbness you are having, but did not find anything like tumors, heart attacks or strokes.  For your fever take ibuprofen 400 to 600 mg 3 times a day, or Tylenol, 650 mg every 4 hours.  Make sure you are getting plenty of rest and drinking a lot of fluids.  Follow-up with a primary care doctor for checkup in 2 to 3 weeks.  Return here, if needed, for problems.  We are prescribing Flonase to help the chronic sinusitis.

## 2017-10-25 ENCOUNTER — Other Ambulatory Visit: Payer: Self-pay

## 2017-10-25 ENCOUNTER — Emergency Department (HOSPITAL_COMMUNITY)
Admission: EM | Admit: 2017-10-25 | Discharge: 2017-10-25 | Disposition: A | Discharge status: Home / Self Care | Payer: Self-pay | Attending: Emergency Medicine | Admitting: Emergency Medicine

## 2017-10-25 ENCOUNTER — Encounter (HOSPITAL_COMMUNITY): Payer: Self-pay

## 2017-10-25 DIAGNOSIS — F17210 Nicotine dependence, cigarettes, uncomplicated: Secondary | ICD-10-CM | POA: Insufficient documentation

## 2017-10-25 DIAGNOSIS — Z7982 Long term (current) use of aspirin: Secondary | ICD-10-CM | POA: Insufficient documentation

## 2017-10-25 DIAGNOSIS — R590 Localized enlarged lymph nodes: Secondary | ICD-10-CM | POA: Insufficient documentation

## 2017-10-25 DIAGNOSIS — R599 Enlarged lymph nodes, unspecified: Secondary | ICD-10-CM

## 2017-10-25 HISTORY — DX: Non-Hodgkin lymphoma, unspecified, unspecified site: C85.90

## 2017-10-25 HISTORY — DX: Malignant (primary) neoplasm, unspecified: C80.1

## 2017-10-25 LAB — CBC WITH DIFFERENTIAL/PLATELET
BASOS ABS: 0 10*3/uL (ref 0.0–0.1)
Basophils Relative: 0 %
Eosinophils Absolute: 0.2 10*3/uL (ref 0.0–0.7)
Eosinophils Relative: 2 %
HEMATOCRIT: 46.5 % (ref 39.0–52.0)
HEMOGLOBIN: 15.9 g/dL (ref 13.0–17.0)
LYMPHS ABS: 1.6 10*3/uL (ref 0.7–4.0)
Lymphocytes Relative: 20 %
MCH: 30.3 pg (ref 26.0–34.0)
MCHC: 34.2 g/dL (ref 30.0–36.0)
MCV: 88.7 fL (ref 78.0–100.0)
Monocytes Absolute: 0.5 10*3/uL (ref 0.1–1.0)
Monocytes Relative: 6 %
NEUTROS ABS: 5.6 10*3/uL (ref 1.7–7.7)
NEUTROS PCT: 72 %
PLATELETS: 203 10*3/uL (ref 150–400)
RBC: 5.24 MIL/uL (ref 4.22–5.81)
RDW: 13.5 % (ref 11.5–15.5)
WBC: 7.9 10*3/uL (ref 4.0–10.5)

## 2017-10-25 LAB — COMPREHENSIVE METABOLIC PANEL
ALT: 17 U/L (ref 0–44)
AST: 19 U/L (ref 15–41)
Albumin: 4.4 g/dL (ref 3.5–5.0)
Alkaline Phosphatase: 66 U/L (ref 38–126)
Anion gap: 7 (ref 5–15)
BUN: 10 mg/dL (ref 6–20)
CALCIUM: 9.5 mg/dL (ref 8.9–10.3)
CO2: 27 mmol/L (ref 22–32)
CREATININE: 1.08 mg/dL (ref 0.61–1.24)
Chloride: 107 mmol/L (ref 98–111)
GFR calc Af Amer: >60 mL/min (ref >60)
GFR calc non Af Amer: >60 mL/min (ref >60)
Glucose, Bld: 88 mg/dL (ref 70–99)
Potassium: 4.3 mmol/L (ref 3.5–5.1)
Sodium: 141 mmol/L (ref 135–145)
Total Bilirubin: 0.8 mg/dL (ref 0.3–1.2)
Total Protein: 7.5 g/dL (ref 6.5–8.1)

## 2017-10-25 LAB — URINALYSIS, ROUTINE W REFLEX MICROSCOPIC
BACTERIA UA: NONE SEEN
Bilirubin Urine: NEGATIVE
Glucose, UA: NEGATIVE mg/dL
Ketones, ur: NEGATIVE mg/dL
Leukocytes, UA: NEGATIVE
Nitrite: NEGATIVE
PH: 6 (ref 5.0–8.0)
Protein, ur: NEGATIVE mg/dL
SPECIFIC GRAVITY, URINE: 1.014 (ref 1.005–1.030)

## 2017-10-25 LAB — RAPID HIV SCREEN (HIV 1/2 AB+AG)
HIV 1/2 ANTIBODIES: NONREACTIVE
HIV-1 P24 Antigen - HIV24: NONREACTIVE

## 2017-10-25 MED ORDER — MUPIROCIN CALCIUM 2 % EX CREA: 1.0000 "application " | TOPICAL_CREAM | Freq: Two times a day (BID) | CUTANEOUS | 0 refills | Status: AC

## 2017-10-25 NOTE — ED Notes (Signed)
Patient given discharge teaching and verbalized understanding. Patient ambulated out of ED with a steady gait. 

## 2017-10-25 NOTE — ED Triage Notes (Signed)
Patient states he was seen at [facility] 3 days ago with abdominal pain and fever. Patient c/o right groin and lower abdominal pain and swelling. Patient denies any urinary problems. Patient states he had a temp of 104/3 days ago.

## 2017-10-25 NOTE — ED Provider Notes (Signed)
[facility] DEPT Provider Note   CSN: [id number] Arrival date & time: 10/25/17  0930     History   Chief Complaint Chief Complaint  Patient presents with  . Abdominal Pain  . Groin Pain    HPI [name] is a 47 y.o. male.  HPI Patient presents with inguinal swelling and pain that he noticed 3 days ago.  Has a lesion at the base of his penis for unknown period of time.  Denies any pain at the site.  States he gets frequent ingrown hairs.  Denies nausea or vomiting.  Normal bowel movements.  States he did have a fever a day before his symptoms started but none since.  Denies any other rashes.  No dysuria, hematuria, frequency or urgency.  No penile discharge.  No scrotal swelling or pain. Past Medical History:  Diagnosis Date  . Back injury   . Cancer ([place])   . Depression   . GERD (gastroesophageal reflux disease)    TUMS IF NEEDED  . Heart murmur    NO SYMPTOMS  . Lymphoma ([place])   . Polysubstance abuse ([place])    10/26/2006 @ birth of son  STOPPED DRINKING-STATES NEVER HEAVY USER OF ALCOHOL  . Shortness of breath    pt is a smoker  . Sleep apnea 10/29/11   STOP BANG SCORE OF 4  . Weight loss    NIGHT SWEATS, LOSS OF APPETITE, NO ENERGY, LOSS OF WEIGHT,--? POSS LYMPHOMA--BIOSPY PLANNED    Patient Active Problem List   Diagnosis Date Noted  . Family history of melanoma 11/29/2013  . Sleep apnea, obstructive 10/30/2011  . Inguinal lymphadenopathy 10/27/2011    Past Surgical History:  Procedure Laterality Date  . inguinal lymph node biopsy  11/03/11   Dr Brantley Stage  . KNEE SURGERY     RT KNEE ARTHROSCOPY        Home Medications    Prior to Admission medications   Medication Sig Start Date End Date Taking? Authorizing Provider  acetaminophen (TYLENOL) 500 MG tablet Take 1,000 mg by mouth daily as needed for moderate pain.   Yes [provider]  ASPIRIN 81 PO Take 81 mg by mouth daily.   Yes [provider]    fluticasone (FLONASE) 50 MCG/ACT nasal spray Place 2 sprays into both nostrils daily. Patient not taking: Reported on 10/25/2017 10/23/17   Daleen Bo, MD  mupirocin cream (BACTROBAN) 2 % Apply 1 application topically 2 (two) times daily. 10/25/17   Julianne Rice, MD    Family History Family History  Problem Relation Age of Onset  . Colon cancer Maternal Grandfather     Social History Social History   Tobacco Use  . Smoking status: Current Every Day Smoker    Packs/day: 1.00    Years: 10.00    Pack years: 10.00    Types: Cigarettes  . Smokeless tobacco: Never Used  Substance Use Topics  . Alcohol use: Not Currently    Comment:  quit 10/26/2006  . Drug use: Not Currently    Comment: quit 10/26/2006     Allergies   Patient has no known allergies.   Review of Systems Review of Systems  Constitutional: Positive for fever. Negative for chills.  Respiratory: Negative for cough and shortness of breath.   Cardiovascular: Negative for chest pain.  Gastrointestinal: Negative for abdominal pain, constipation, diarrhea, nausea and vomiting.  Genitourinary: Positive for genital sores. Negative for discharge, dysuria, flank pain, frequency, hematuria, penile pain, penile swelling,  scrotal swelling, testicular pain and urgency.  Musculoskeletal: Negative for back pain, myalgias and neck pain.  Skin: Positive for rash. Negative for wound.  Neurological: Negative for dizziness, weakness, light-headedness, numbness and headaches.  All other systems reviewed and are negative.    Physical Exam Updated Vital Signs BP 136/84 (BP Location: Left Arm)   Pulse 70   Temp 98.2 F (36.8 C) (Oral)   Resp 20   Ht 6' (1.829 m)   Wt 81.6 kg   SpO2 100%   BMI 24.41 kg/m   Physical Exam  Constitutional: He is oriented to person, place, and time. He appears well-developed and well-nourished. No distress.  HENT:  Head: Normocephalic and atraumatic.  Mouth/Throat: Oropharynx is clear  and moist. No oropharyngeal exudate.  Eyes: Pupils are equal, round, and reactive to light. EOM are normal.  Neck: Normal range of motion. Neck supple.  Cardiovascular: Normal rate and regular rhythm. Exam reveals no gallop and no friction rub.  No murmur heard. Pulmonary/Chest: Effort normal and breath sounds normal. No stridor. No respiratory distress. He has no wheezes. He has no rales. He exhibits no tenderness.  Abdominal: Soft. Bowel sounds are normal. There is no tenderness. There is no rebound and no guarding.  Genitourinary:  Genitourinary Comments: Mild bilateral inguinal tenderness to palpation.  No obvious lymphadenopathy.  No appreciated inguinal hernias.  Patient does have a scaly lesion at the base of his penis.  There is very mild surrounding erythema.  No penile discharge.  No scrotal swelling or testicular tenderness.  Musculoskeletal: Normal range of motion. He exhibits no edema or tenderness.  No CVA tenderness bilaterally.  Neurological: He is alert and oriented to person, place, and time.  Moving all extremities without focal deficit.  Sensation fully intact.  Skin: Skin is warm and dry. Capillary refill takes less than 2 seconds. No rash noted. He is not diaphoretic. No erythema.  Psychiatric: He has a normal mood and affect. His behavior is normal.  Nursing note and vitals reviewed.    ED Treatments / Results  Labs (all labs ordered are listed, but only abnormal results are displayed) Labs Reviewed  URINALYSIS, ROUTINE W REFLEX MICROSCOPIC - Abnormal; Notable for the following components:      Result Value   Hgb urine dipstick MODERATE (*)    All other components within normal limits  CBC WITH DIFFERENTIAL/PLATELET  COMPREHENSIVE METABOLIC PANEL  RAPID HIV SCREEN (HIV 1/2 AB+AG)  RPR  GC/CHLAMYDIA PROBE AMP ([place]) NOT AT [facility]    EKG None  Radiology No results found.  Procedures Procedures (including critical care time)  Medications Ordered in  ED Medications - No data to display   Initial Impression / Assessment and Plan / ED Course  I have reviewed the triage vital signs and the nursing notes.  Pertinent labs & imaging results that were available during my care of the patient were reviewed by me and considered in my medical decision making (see chart for details).    Labs are reassuring.  RPR, GC chlamydia pending.  Suspect likely folliculitis with regional lymphadenopathy.  Will treat with antibiotic ointment.  Return precautions given.   Final Clinical Impressions(s) / ED Diagnoses   Final diagnoses:  Reactive lymphadenopathy    ED Discharge Orders         Ordered    mupirocin cream (BACTROBAN) 2 %  2 times daily     10/25/17 1252           Julianne Rice,  MD 10/25/17 1253

## 2017-10-26 LAB — RPR: RPR Ser Ql: NONREACTIVE

## 2018-04-05 ENCOUNTER — Emergency Department (HOSPITAL_COMMUNITY): Payer: Self-pay

## 2018-04-05 ENCOUNTER — Other Ambulatory Visit: Payer: Self-pay

## 2018-04-05 ENCOUNTER — Emergency Department (HOSPITAL_COMMUNITY)
Admission: EM | Admit: 2018-04-05 | Discharge: 2018-04-05 | Disposition: A | Discharge status: Home / Self Care | Payer: Self-pay | Attending: Emergency Medicine | Admitting: Emergency Medicine

## 2018-04-05 ENCOUNTER — Encounter (HOSPITAL_COMMUNITY): Payer: Self-pay | Admitting: Emergency Medicine

## 2018-04-05 DIAGNOSIS — Y929 Unspecified place or not applicable: Secondary | ICD-10-CM | POA: Insufficient documentation

## 2018-04-05 DIAGNOSIS — T148XXA Other injury of unspecified body region, initial encounter: Secondary | ICD-10-CM

## 2018-04-05 DIAGNOSIS — Z79899 Other long term (current) drug therapy: Secondary | ICD-10-CM | POA: Insufficient documentation

## 2018-04-05 DIAGNOSIS — W1789XA Other fall from one level to another, initial encounter: Secondary | ICD-10-CM | POA: Insufficient documentation

## 2018-04-05 DIAGNOSIS — Z7982 Long term (current) use of aspirin: Secondary | ICD-10-CM | POA: Insufficient documentation

## 2018-04-05 DIAGNOSIS — Y9339 Activity, other involving climbing, rappelling and jumping off: Secondary | ICD-10-CM | POA: Insufficient documentation

## 2018-04-05 DIAGNOSIS — F17210 Nicotine dependence, cigarettes, uncomplicated: Secondary | ICD-10-CM | POA: Insufficient documentation

## 2018-04-05 DIAGNOSIS — W19XXXA Unspecified fall, initial encounter: Secondary | ICD-10-CM

## 2018-04-05 DIAGNOSIS — S50811A Abrasion of right forearm, initial encounter: Secondary | ICD-10-CM | POA: Insufficient documentation

## 2018-04-05 DIAGNOSIS — Z859 Personal history of malignant neoplasm, unspecified: Secondary | ICD-10-CM | POA: Insufficient documentation

## 2018-04-05 DIAGNOSIS — Y999 Unspecified external cause status: Secondary | ICD-10-CM | POA: Insufficient documentation

## 2018-04-05 MED ORDER — IBUPROFEN 800 MG PO TABS
800.0000 mg | ORAL_TABLET | Freq: Once | ORAL | Status: AC
  Administered 2018-04-05: 800 mg via ORAL
  Filled 2018-04-05: qty 1

## 2018-04-05 MED ORDER — NAPROXEN 500 MG PO TABS: 500.0000 mg | ORAL_TABLET | Freq: Two times a day (BID) | ORAL | 0 refills | Status: AC

## 2018-04-05 NOTE — ED Provider Notes (Signed)
[facility] EMERGENCY DEPARTMENT Provider Note   CSN: [id number] Arrival date & time: 04/05/18  1203     History   Chief Complaint Chief Complaint  Patient presents with  . Fall    HPI [name] is a 48 y.o. male.  HPI Patient presents to the emergency room for evaluation of pain after fall.  Patient was working today when he stepped on a bucket.  Bucket tipped over and the patient fell to the ground landing on concrete.  Happened about 45 minutes ago.  Patient states he is having pain on his right shoulder, right forearm, right ribs, lower back and right hip area.  Whenever he stands the pain shoots up his back.  He denies any head injury or loss of consciousness.  No difficulty breathing.  No abdominal pain.  No numbness or weakness. Past Medical History:  Diagnosis Date  . Back injury   . Cancer ([place])   . Depression   . GERD (gastroesophageal reflux disease)    TUMS IF NEEDED  . Heart murmur    NO SYMPTOMS  . Lymphoma ([place])   . Polysubstance abuse ([place])    10/26/2006 @ birth of son  STOPPED DRINKING-STATES NEVER HEAVY USER OF ALCOHOL  . Shortness of breath    pt is a smoker  . Sleep apnea 10/29/11   STOP BANG SCORE OF 4  . Weight loss    NIGHT SWEATS, LOSS OF APPETITE, NO ENERGY, LOSS OF WEIGHT,--? POSS LYMPHOMA--BIOSPY PLANNED    Patient Active Problem List   Diagnosis Date Noted  . Family history of melanoma 11/29/2013  . Sleep apnea, obstructive 10/30/2011  . Inguinal lymphadenopathy 10/27/2011    Past Surgical History:  Procedure Laterality Date  . inguinal lymph node biopsy  11/03/11   Dr Brantley Stage  . KNEE SURGERY     RT KNEE ARTHROSCOPY        Home Medications    Prior to Admission medications   Medication Sig Start Date End Date Taking? Authorizing Provider  acetaminophen (TYLENOL) 500 MG tablet Take 1,000 mg by mouth daily as needed for moderate pain.    [provider]  ASPIRIN 81 PO Take 81 mg by mouth daily.     [provider]  fluticasone (FLONASE) 50 MCG/ACT nasal spray Place 2 sprays into both nostrils daily. Patient not taking: Reported on 10/25/2017 10/23/17   Daleen Bo, MD  mupirocin cream (BACTROBAN) 2 % Apply 1 application topically 2 (two) times daily. 10/25/17   Julianne Rice, MD  naproxen (NAPROSYN) 500 MG tablet Take 1 tablet (500 mg total) by mouth 2 (two) times daily. 04/05/18   Dorie Rank, MD    Family History Family History  Problem Relation Age of Onset  . Colon cancer Maternal Grandfather     Social History Social History   Tobacco Use  . Smoking status: Current Every Day Smoker    Packs/day: 1.00    Years: 10.00    Pack years: 10.00    Types: Cigarettes  . Smokeless tobacco: Never Used  Substance Use Topics  . Alcohol use: Not Currently    Comment:  quit 10/26/2006  . Drug use: Not Currently    Comment: quit 10/26/2006     Allergies   Patient has no known allergies.   Review of Systems Review of Systems  All other systems reviewed and are negative.    Physical Exam Updated Vital Signs BP 127/77 (BP Location: Right Arm)   Pulse 88  Temp 98.6 F (37 C) (Oral)   Resp 17   Ht 1.829 m (6')   Wt 77.1 kg   SpO2 96%   BMI 23.06 kg/m   Physical Exam Vitals signs and nursing note reviewed.  Constitutional:      General: He is not in acute distress.    Appearance: He is well-developed.  HENT:     Head: Normocephalic and atraumatic.     Right Ear: External ear normal.     Left Ear: External ear normal.  Eyes:     General: No scleral icterus.       Right eye: No discharge.        Left eye: No discharge.     Conjunctiva/sclera: Conjunctivae normal.  Neck:     Musculoskeletal: Neck supple.     Trachea: No tracheal deviation.  Cardiovascular:     Rate and Rhythm: Normal rate and regular rhythm.  Pulmonary:     Effort: Pulmonary effort is normal. No respiratory distress.     Breath sounds: Normal breath sounds. No stridor.  Abdominal:      General: There is no distension.     Palpations: Abdomen is soft. There is no mass.     Tenderness: There is no abdominal tenderness.  Musculoskeletal:        General: No swelling or deformity.     Right shoulder: He exhibits tenderness. He exhibits no bony tenderness and no swelling.     Left shoulder: He exhibits no tenderness, no bony tenderness and no swelling.     Right elbow: Normal.    Right wrist: He exhibits no tenderness, no bony tenderness and no swelling.     Left wrist: He exhibits no tenderness, no bony tenderness and no swelling.     Right hip: He exhibits tenderness. He exhibits normal range of motion, no bony tenderness and no swelling.     Left hip: He exhibits normal range of motion, no tenderness and no bony tenderness.     Right ankle: He exhibits no swelling. No tenderness.     Left ankle: He exhibits no swelling. No tenderness.     Cervical back: He exhibits no tenderness, no bony tenderness and no swelling.     Thoracic back: He exhibits no tenderness, no bony tenderness and no swelling.     Lumbar back: He exhibits tenderness. He exhibits no bony tenderness and no swelling.     Right forearm: He exhibits tenderness.     Comments: Superficial abrasion right forearm  Skin:    General: Skin is warm and dry.     Findings: No rash.  Neurological:     Mental Status: He is alert.     Cranial Nerves: Cranial nerve deficit: no gross deficits.      ED Treatments / Results  Labs (all labs ordered are listed, but only abnormal results are displayed) Labs Reviewed - No data to display  EKG None  Radiology Dg Ribs Unilateral W/chest Right  Result Date: 04/05/2018 CLINICAL DATA:  The patient fell off a bucket today. Right chest pain. Initial encounter. EXAM: RIGHT RIBS AND CHEST - 3+ VIEW COMPARISON:  PA and lateral chest 11/22/2017. FINDINGS: The lungs are clear. No pneumothorax or pleural effusion. Heart size is normal. No fracture. IMPRESSION: Negative for  fracture.  Negative chest. Electronically Signed   By: Inge Rise M.D.   On: 04/05/2018 13:12   Dg Lumbar Spine Complete  Result Date: 04/05/2018 CLINICAL DATA:  Recent fall with  low back pain, initial encounter EXAM: LUMBAR SPINE - COMPLETE 4+ VIEW COMPARISON:  None. FINDINGS: Five lumbar type vertebral bodies are well visualized. A transitional segment is noted as well. Twelve paired ribs are noted on prior chest x-ray. Mild osteophytic changes are seen with disc space narrowing at L4-5 and at the L5-transitional level. No anterolisthesis is seen. No pars defects are noted. No soft tissue abnormality is seen. IMPRESSION: Transitional anatomy with mild degenerative change. No acute abnormality noted. Electronically Signed   By: Inez Catalina M.D.   On: 04/05/2018 13:12   Dg Shoulder Right  Result Date: 04/05/2018 CLINICAL DATA:  Recent fall with right shoulder pain, initial encounter EXAM: RIGHT SHOULDER - 2+ VIEW COMPARISON:  None. FINDINGS: There is no evidence of fracture or dislocation. There is no evidence of arthropathy or other focal bone abnormality. Soft tissues are unremarkable. IMPRESSION: No acute abnormality noted. Electronically Signed   By: Inez Catalina M.D.   On: 04/05/2018 13:11   Dg Forearm Right  Result Date: 04/05/2018 CLINICAL DATA:  Right forearm pain and swelling after fall. EXAM: RIGHT FOREARM - 2 VIEW COMPARISON:  None. FINDINGS: There is no evidence of fracture or other focal bone lesions. Soft tissues are unremarkable. IMPRESSION: Negative. Electronically Signed   By: Marijo Conception, M.D.   On: 04/05/2018 13:15   Dg Hip Unilat W Or Wo Pelvis 2-3 Views Right  Result Date: 04/05/2018 CLINICAL DATA:  Right hip pain after fall. EXAM: DG HIP (WITH OR WITHOUT PELVIS) 2-3V RIGHT COMPARISON:  None. FINDINGS: There is no evidence of hip fracture or dislocation. There is no evidence of arthropathy or other focal bone abnormality. IMPRESSION: Negative. Electronically Signed   By:  Marijo Conception, M.D.   On: 04/05/2018 13:13    Procedures Procedures (including critical care time)  Medications Ordered in ED Medications  ibuprofen (ADVIL,MOTRIN) tablet 800 mg (800 mg Oral Given 04/05/18 1221)     Initial Impression / Assessment and Plan / ED Course  I have reviewed the triage vital signs and the nursing notes.  Pertinent labs & imaging results that were available during my care of the patient were reviewed by me and considered in my medical decision making (see chart for details).   Presented to the emergency room for evaluation after a fall.  X-rays do not show any evidence of acute fracture.  Patient does have pain with range of motion of his right elbow but x-rays do not demonstrate any signs of a fat pad.  Formal elbow films were not ordered however his forearm films do show the right elbow joint well.  Symptoms are most likely related to a contusion.  I did explain to the patient is possible hairline fracture may not show up.  I recommend follow-up with an orthopedic doctor.  Ice NSAIDs as needed  Final Clinical Impressions(s) / ED Diagnoses   Final diagnoses:  Fall, initial encounter  Muscle contusion    ED Discharge Orders         Ordered    naproxen (NAPROSYN) 500 MG tablet  2 times daily     04/05/18 1347           Dorie Rank, MD 04/05/18 1349

## 2018-04-05 NOTE — Discharge Instructions (Addendum)
Take the medications as needed for pain, follow-up with an orthopedic doctor for further evaluation if the symptoms persist

## 2018-04-05 NOTE — ED Triage Notes (Signed)
Patient arrives status post fall. Complaints of right arm pain, right hip, right back and right leg. Fall occurred approx 1130 am. Denies hitting head, no LOC reported.

## 2018-04-05 NOTE — ED Notes (Signed)
ED Provider, Tomi Bamberger  at bedside.

## 2018-04-05 NOTE — ED Notes (Signed)
Patient transported to X-ray 

## 2018-04-05 NOTE — ED Notes (Signed)
Declined W/C at D/C and was escorted to lobby by RN. 

## 2020-09-26 IMAGING — DX DG HIP (WITH OR WITHOUT PELVIS) 2-3V*R*
3 series · 3 of 3 positions shown · non-contrast
Comparison: None.

CLINICAL DATA: Right hip pain after fall.

EXAM:
DG HIP (WITH OR WITHOUT PELVIS) 2-3V RIGHT

[t pelvis ap]
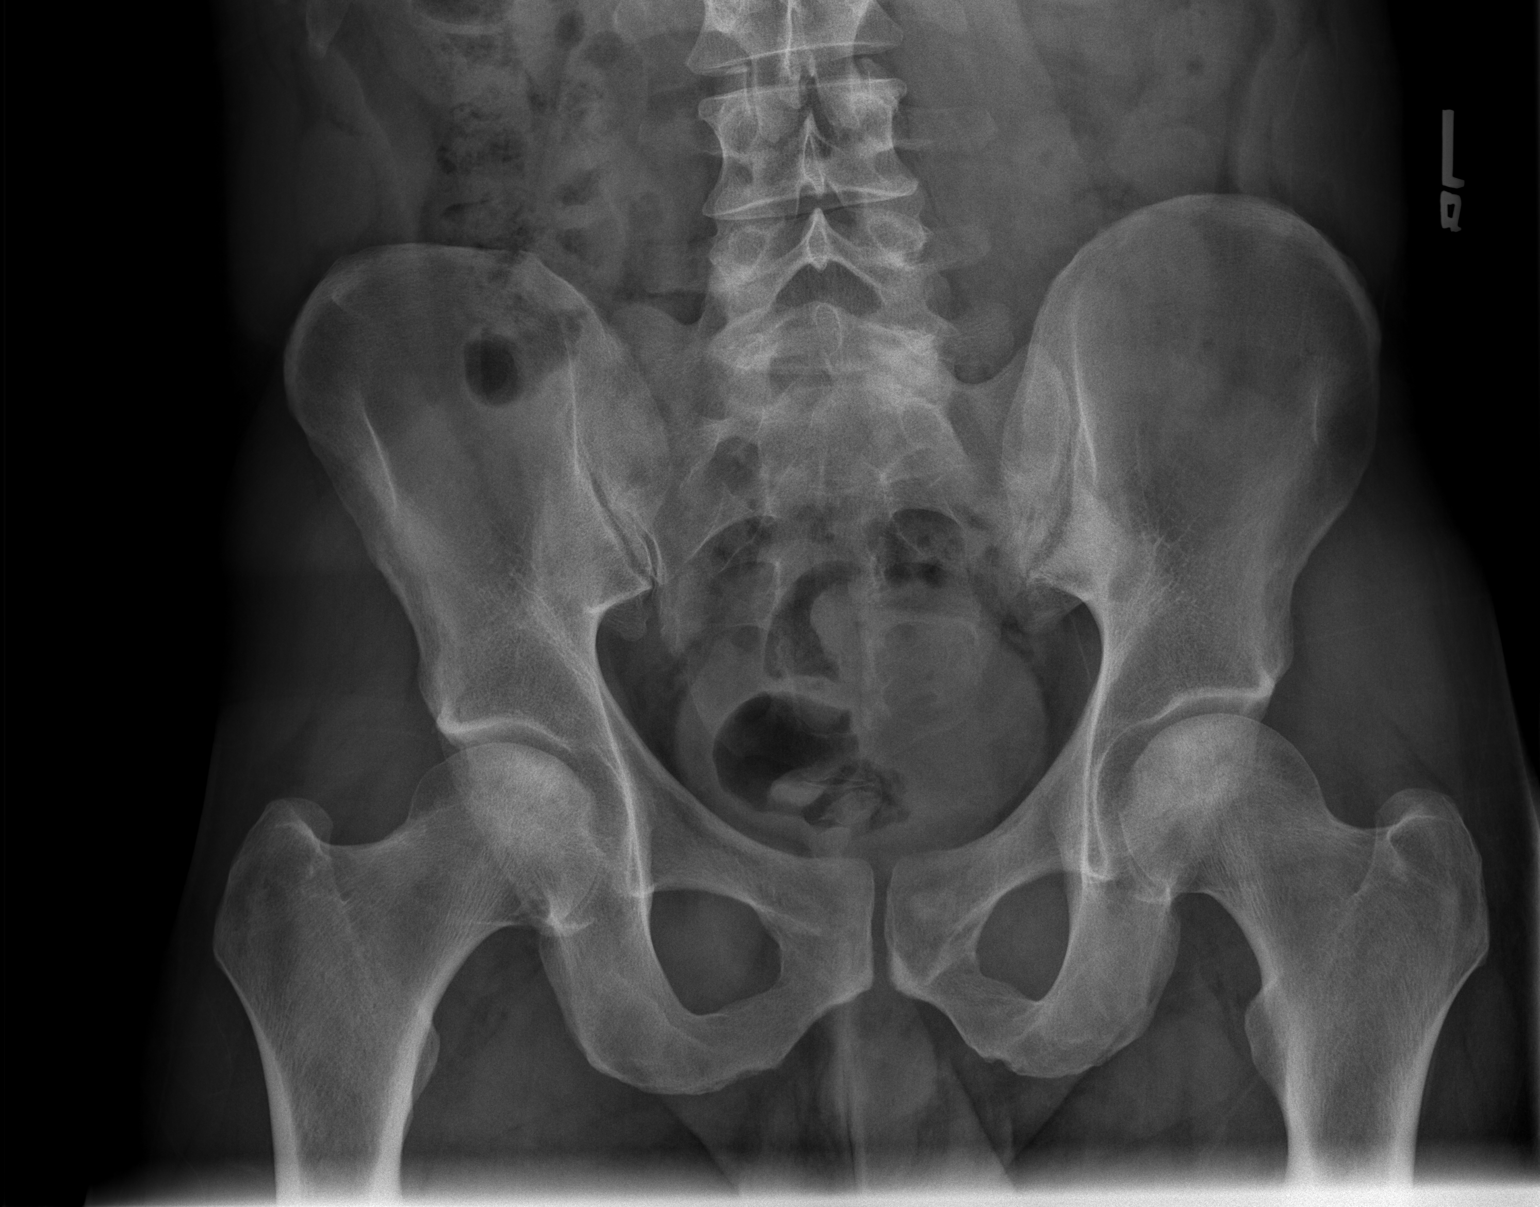

[t hip ap right]
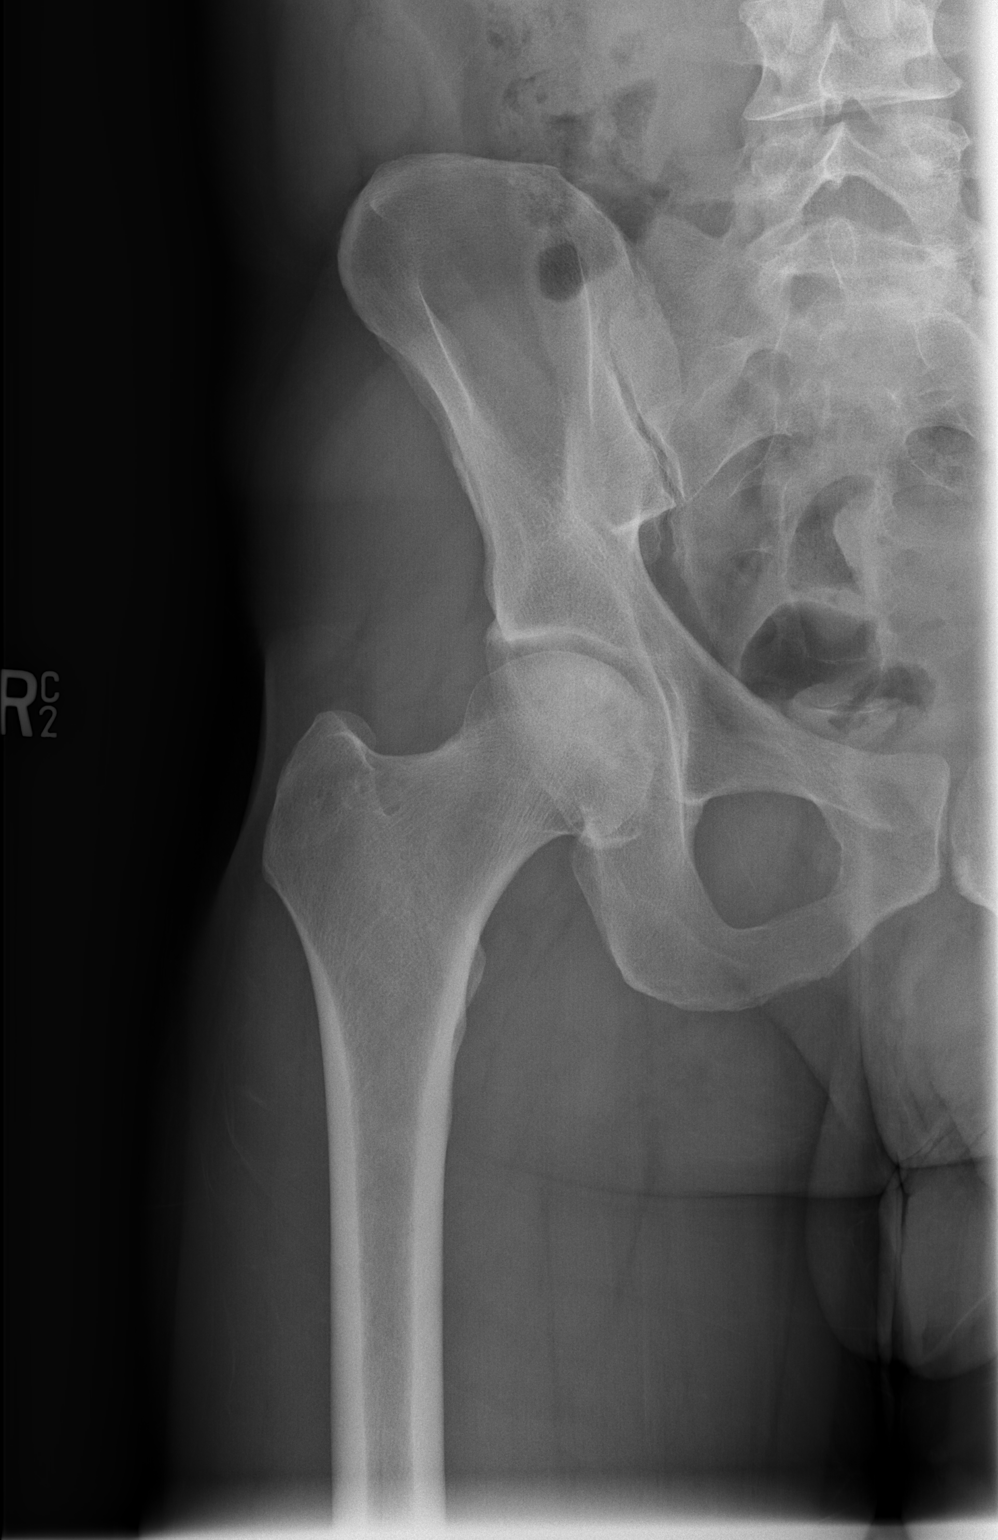

[t hip frog leg right]
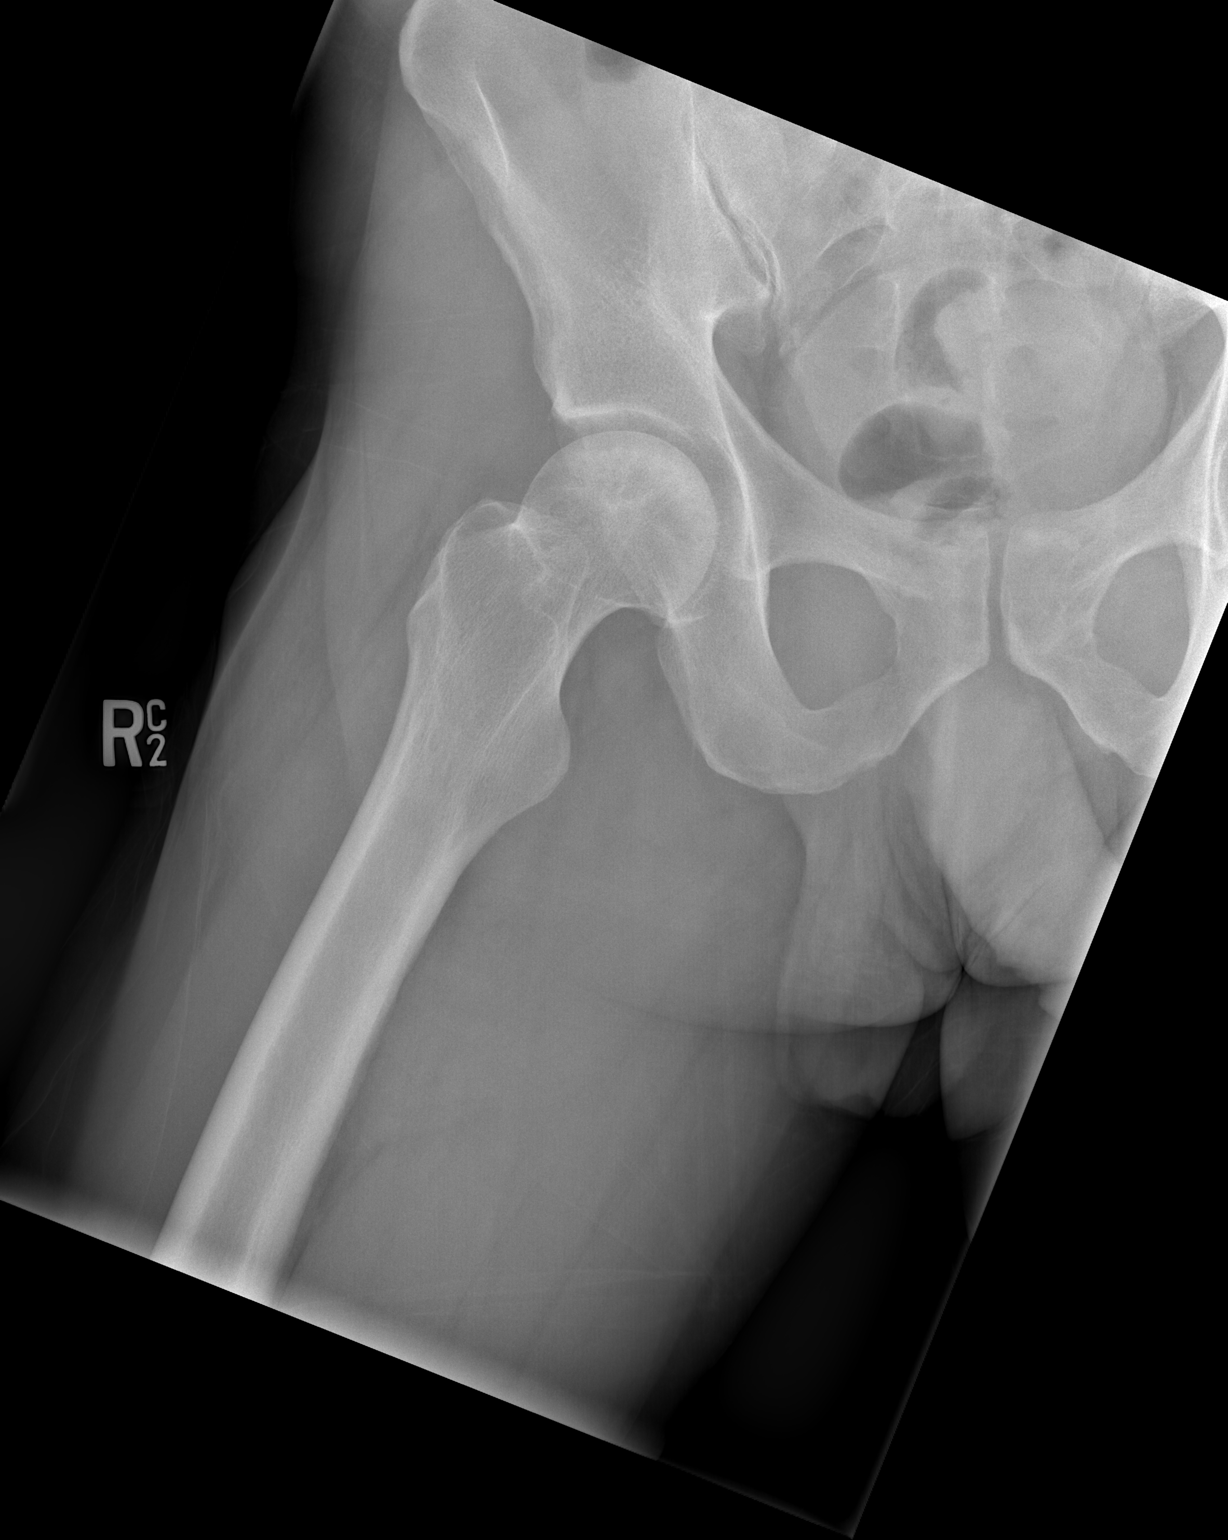

[3 of 3 positions shown; findings below may reference images not displayed]

FINDINGS: There is no evidence of hip fracture or dislocation. There is no
evidence of arthropathy or other focal bone abnormality.
IMPRESSION: Negative.
